# Patient Record
Sex: Female | Born: 1955 | Race: White | Hispanic: No | Marital: Married | Smoking: Never smoker
Health system: Southern US, Community
[De-identification: ages and names within clinical notes are randomized; demographics above are authoritative.]

## PROBLEM LIST (undated history)

## (undated) DIAGNOSIS — J45909 Unspecified asthma, uncomplicated: Secondary | ICD-10-CM

## (undated) DIAGNOSIS — K8020 Calculus of gallbladder without cholecystitis without obstruction: Secondary | ICD-10-CM

## (undated) DIAGNOSIS — N200 Calculus of kidney: Secondary | ICD-10-CM

## (undated) DIAGNOSIS — K219 Gastro-esophageal reflux disease without esophagitis: Secondary | ICD-10-CM

## (undated) DIAGNOSIS — I10 Essential (primary) hypertension: Secondary | ICD-10-CM

## (undated) DIAGNOSIS — I456 Pre-excitation syndrome: Secondary | ICD-10-CM

## (undated) DIAGNOSIS — Z8051 Family history of malignant neoplasm of kidney: Secondary | ICD-10-CM

## (undated) DIAGNOSIS — E669 Obesity, unspecified: Secondary | ICD-10-CM

## (undated) DIAGNOSIS — K3184 Gastroparesis: Secondary | ICD-10-CM

## (undated) DIAGNOSIS — I499 Cardiac arrhythmia, unspecified: Secondary | ICD-10-CM

## (undated) DIAGNOSIS — K589 Irritable bowel syndrome without diarrhea: Secondary | ICD-10-CM

## (undated) DIAGNOSIS — Z950 Presence of cardiac pacemaker: Secondary | ICD-10-CM

## (undated) DIAGNOSIS — C801 Malignant (primary) neoplasm, unspecified: Secondary | ICD-10-CM

## (undated) HISTORY — PX: STOMACH SURGERY: SHX791

## (undated) HISTORY — DX: Obesity, unspecified: E66.9

## (undated) HISTORY — DX: Calculus of gallbladder without cholecystitis without obstruction: K80.20

## (undated) HISTORY — PX: ABDOMINAL HYSTERECTOMY: SHX81

## (undated) HISTORY — PX: CHOLECYSTECTOMY: SHX55

## (undated) HISTORY — PX: PACEMAKER REMOVAL: SHX5066

## (undated) HISTORY — PX: KIDNEY SURGERY: SHX687

## (undated) HISTORY — DX: Cardiac arrhythmia, unspecified: I49.9

## (undated) HISTORY — PX: LITHOTRIPSY: SUR834

## (undated) HISTORY — DX: Gastro-esophageal reflux disease without esophagitis: K21.9

## (undated) HISTORY — DX: Irritable bowel syndrome, unspecified: K58.9

## (undated) HISTORY — PX: KNEE SURGERY: SHX244

## (undated) HISTORY — PX: PACEMAKER INSERTION: SHX728

## (undated) HISTORY — PX: CARDIAC SURGERY: SHX584

## (undated) HISTORY — PX: HEMORRHOID SURGERY: SHX153

## (undated) HISTORY — PX: AV NODE ABLATION: SHX1209

## (undated) HISTORY — PX: OTHER SURGICAL HISTORY: SHX169

---

## 1978-01-25 HISTORY — PX: APPENDECTOMY: SHX54

## 2010-04-22 ENCOUNTER — Emergency Department (HOSPITAL_BASED_OUTPATIENT_CLINIC_OR_DEPARTMENT_OTHER)
Admission: EM | Admit: 2010-04-22 | Discharge: 2010-04-23 | Disposition: A | Discharge status: Home / Self Care | Payer: Medicaid Other | Attending: Emergency Medicine | Admitting: Emergency Medicine

## 2010-04-22 ENCOUNTER — Emergency Department (INDEPENDENT_AMBULATORY_CARE_PROVIDER_SITE_OTHER): Payer: Medicaid Other

## 2010-04-22 DIAGNOSIS — Z79899 Other long term (current) drug therapy: Secondary | ICD-10-CM | POA: Insufficient documentation

## 2010-04-22 DIAGNOSIS — R109 Unspecified abdominal pain: Secondary | ICD-10-CM

## 2010-04-22 DIAGNOSIS — K7689 Other specified diseases of liver: Secondary | ICD-10-CM | POA: Insufficient documentation

## 2010-04-22 DIAGNOSIS — M545 Low back pain, unspecified: Secondary | ICD-10-CM | POA: Insufficient documentation

## 2010-04-22 DIAGNOSIS — R110 Nausea: Secondary | ICD-10-CM

## 2010-04-22 LAB — COMPREHENSIVE METABOLIC PANEL
ALT: 27 U/L (ref 0–35)
AST: 45 U/L — ABNORMAL HIGH (ref 0–37)
Alkaline Phosphatase: 83 U/L (ref 39–117)
CO2: 26 mEq/L (ref 19–32)
Calcium: 9.3 mg/dL (ref 8.4–10.5)
GFR calc Af Amer: >60 mL/min (ref >60)
Glucose, Bld: 103 mg/dL — ABNORMAL HIGH (ref 70–99)
Potassium: 5.4 mEq/L — ABNORMAL HIGH (ref 3.5–5.1)
Sodium: 146 mEq/L — ABNORMAL HIGH (ref 135–145)
Total Protein: 8.4 g/dL — ABNORMAL HIGH (ref 6.0–8.3)

## 2010-04-22 LAB — CBC
HCT: 41.7 % (ref 36.0–46.0)
Hemoglobin: 13.8 g/dL (ref 12.0–15.0)
MCHC: 33.1 g/dL (ref 30.0–36.0)
RDW: 12.6 % (ref 11.5–15.5)
WBC: 8 10*3/uL (ref 4.0–10.5)

## 2010-04-22 LAB — DIFFERENTIAL
Basophils Absolute: 0 10*3/uL (ref 0.0–0.1)
Basophils Relative: 1 % (ref 0–1)
Lymphocytes Relative: 46 % (ref 12–46)
Monocytes Absolute: 0.9 10*3/uL (ref 0.1–1.0)
Neutro Abs: 3.3 10*3/uL (ref 1.7–7.7)

## 2010-04-22 LAB — URINALYSIS, ROUTINE W REFLEX MICROSCOPIC
Nitrite: NEGATIVE
Specific Gravity, Urine: 1.029 (ref 1.005–1.030)
Urobilinogen, UA: 1 mg/dL (ref 0.0–1.0)
pH: 6 (ref 5.0–8.0)

## 2010-04-22 MED ORDER — IOHEXOL 300 MG/ML  SOLN
100.0000 mL | Freq: Once | INTRAMUSCULAR | Status: AC | PRN
  Administered 2010-04-22: 100 mL via INTRAVENOUS

## 2010-07-19 ENCOUNTER — Emergency Department (HOSPITAL_BASED_OUTPATIENT_CLINIC_OR_DEPARTMENT_OTHER)
Admission: EM | Admit: 2010-07-19 | Discharge: 2010-07-19 | Disposition: A | Discharge status: Home / Self Care | Payer: Medicaid Other | Attending: Emergency Medicine | Admitting: Emergency Medicine

## 2010-07-19 ENCOUNTER — Emergency Department (INDEPENDENT_AMBULATORY_CARE_PROVIDER_SITE_OTHER): Payer: Medicaid Other

## 2010-07-19 ENCOUNTER — Emergency Department (HOSPITAL_BASED_OUTPATIENT_CLINIC_OR_DEPARTMENT_OTHER): Payer: Medicaid Other

## 2010-07-19 DIAGNOSIS — M549 Dorsalgia, unspecified: Secondary | ICD-10-CM

## 2010-07-19 DIAGNOSIS — R112 Nausea with vomiting, unspecified: Secondary | ICD-10-CM | POA: Insufficient documentation

## 2010-07-19 DIAGNOSIS — R109 Unspecified abdominal pain: Secondary | ICD-10-CM

## 2010-07-19 DIAGNOSIS — R300 Dysuria: Secondary | ICD-10-CM

## 2010-07-19 LAB — CBC
HCT: 42.2 % (ref 36.0–46.0)
Hemoglobin: 14.2 g/dL (ref 12.0–15.0)
MCH: 30.1 pg (ref 26.0–34.0)
MCHC: 33.6 g/dL (ref 30.0–36.0)
MCV: 89.4 fL (ref 78.0–100.0)
Platelets: 226 K/uL (ref 150–400)
RBC: 4.72 MIL/uL (ref 3.87–5.11)
RDW: 13.1 % (ref 11.5–15.5)
WBC: 8.8 K/uL (ref 4.0–10.5)

## 2010-07-19 LAB — URINALYSIS, ROUTINE W REFLEX MICROSCOPIC
Bilirubin Urine: NEGATIVE
Glucose, UA: NEGATIVE mg/dL
Hgb urine dipstick: NEGATIVE
Ketones, ur: NEGATIVE mg/dL
Leukocytes, UA: NEGATIVE
Nitrite: NEGATIVE
Protein, ur: NEGATIVE mg/dL
Specific Gravity, Urine: 1.03 (ref 1.005–1.030)
Urobilinogen, UA: 0.2 mg/dL (ref 0.0–1.0)
pH: 6 (ref 5.0–8.0)

## 2010-07-19 LAB — COMPREHENSIVE METABOLIC PANEL WITH GFR
ALT: 24 U/L (ref 0–35)
AST: 29 U/L (ref 0–37)
Albumin: 4.2 g/dL (ref 3.5–5.2)
CO2: 27 meq/L (ref 19–32)
Calcium: 10.1 mg/dL (ref 8.4–10.5)
Chloride: 103 meq/L (ref 96–112)
GFR calc non Af Amer: >60 mL/min (ref >60)
Sodium: 142 meq/L (ref 135–145)
Total Bilirubin: 0.4 mg/dL (ref 0.3–1.2)

## 2010-07-19 LAB — COMPREHENSIVE METABOLIC PANEL
Alkaline Phosphatase: 88 U/L (ref 39–117)
BUN: 11 mg/dL (ref 6–23)
Creatinine, Ser: 0.9 mg/dL (ref 0.50–1.10)
GFR calc Af Amer: >60 mL/min (ref >60)
Glucose, Bld: 111 mg/dL — ABNORMAL HIGH (ref 70–99)
Potassium: 3.4 mEq/L — ABNORMAL LOW (ref 3.5–5.1)
Total Protein: 7.5 g/dL (ref 6.0–8.3)

## 2010-07-19 LAB — DIFFERENTIAL
Basophils Absolute: 0 10*3/uL (ref 0.0–0.1)
Basophils Relative: 0 % (ref 0–1)
Eosinophils Absolute: 0.1 K/uL (ref 0.0–0.7)
Eosinophils Relative: 1 % (ref 0–5)
Lymphocytes Relative: 37 % (ref 12–46)
Lymphs Abs: 3.3 10*3/uL (ref 0.7–4.0)
Monocytes Absolute: 1.1 10*3/uL — ABNORMAL HIGH (ref 0.1–1.0)
Monocytes Relative: 13 % — ABNORMAL HIGH (ref 3–12)
Neutro Abs: 4.3 10*3/uL (ref 1.7–7.7)
Neutrophils Relative %: 49 % (ref 43–77)

## 2010-07-19 LAB — LIPASE, BLOOD: Lipase: 74 U/L — ABNORMAL HIGH (ref 11–59)

## 2010-07-19 MED ORDER — IOHEXOL 300 MG/ML  SOLN
100.0000 mL | Freq: Once | INTRAMUSCULAR | Status: AC | PRN
  Administered 2010-07-19: 100 mL via INTRAVENOUS

## 2010-09-13 ENCOUNTER — Encounter: Payer: Self-pay | Admitting: *Deleted

## 2010-09-13 ENCOUNTER — Emergency Department (HOSPITAL_BASED_OUTPATIENT_CLINIC_OR_DEPARTMENT_OTHER)
Admission: EM | Admit: 2010-09-13 | Discharge: 2010-09-13 | Disposition: A | Discharge status: Home / Self Care | Payer: Medicaid Other | Attending: Emergency Medicine | Admitting: Emergency Medicine

## 2010-09-13 DIAGNOSIS — R51 Headache: Secondary | ICD-10-CM | POA: Insufficient documentation

## 2010-09-13 HISTORY — DX: Family history of malignant neoplasm of kidney: Z80.51

## 2010-09-13 HISTORY — DX: Gastroparesis: K31.84

## 2010-09-13 HISTORY — DX: Calculus of kidney: N20.0

## 2010-09-13 HISTORY — DX: Gastro-esophageal reflux disease without esophagitis: K21.9

## 2010-09-13 MED ORDER — PROMETHAZINE HCL 25 MG/ML IJ SOLN
25.0000 mg | Freq: Once | INTRAMUSCULAR | Status: AC
  Administered 2010-09-13: 25 mg via INTRAMUSCULAR
  Filled 2010-09-13: qty 1
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
## 2010-12-16 ENCOUNTER — Emergency Department (INDEPENDENT_AMBULATORY_CARE_PROVIDER_SITE_OTHER): Payer: Medicaid Other

## 2010-12-16 ENCOUNTER — Emergency Department (HOSPITAL_BASED_OUTPATIENT_CLINIC_OR_DEPARTMENT_OTHER)
Admission: EM | Admit: 2010-12-16 | Discharge: 2010-12-16 | Disposition: A | Discharge status: Home / Self Care | Payer: Medicaid Other | Attending: Emergency Medicine | Admitting: Emergency Medicine

## 2010-12-16 ENCOUNTER — Encounter (HOSPITAL_BASED_OUTPATIENT_CLINIC_OR_DEPARTMENT_OTHER): Payer: Self-pay | Admitting: Emergency Medicine

## 2010-12-16 DIAGNOSIS — Y92009 Unspecified place in unspecified non-institutional (private) residence as the place of occurrence of the external cause: Secondary | ICD-10-CM | POA: Insufficient documentation

## 2010-12-16 DIAGNOSIS — W19XXXA Unspecified fall, initial encounter: Secondary | ICD-10-CM

## 2010-12-16 DIAGNOSIS — K219 Gastro-esophageal reflux disease without esophagitis: Secondary | ICD-10-CM | POA: Insufficient documentation

## 2010-12-16 DIAGNOSIS — M25559 Pain in unspecified hip: Secondary | ICD-10-CM

## 2010-12-16 DIAGNOSIS — S7000XA Contusion of unspecified hip, initial encounter: Secondary | ICD-10-CM

## 2010-12-16 DIAGNOSIS — Z79899 Other long term (current) drug therapy: Secondary | ICD-10-CM | POA: Insufficient documentation

## 2010-12-16 DIAGNOSIS — W1789XA Other fall from one level to another, initial encounter: Secondary | ICD-10-CM | POA: Insufficient documentation

## 2010-12-16 MED ORDER — HYDROCODONE-ACETAMINOPHEN 5-500 MG PO TABS: 1.0000 | ORAL_TABLET | Freq: Four times a day (QID) | ORAL | Status: AC | PRN

## 2010-12-16 MED ORDER — HYDROCODONE-ACETAMINOPHEN 5-500 MG PO TABS: 1.0000 | ORAL_TABLET | Freq: Four times a day (QID) | ORAL | Status: DC | PRN

## 2010-12-16 NOTE — ED Provider Notes (Signed)
History     CSN: [id number] Arrival date & time: 12/16/2010  6:49 AM   First MD Initiated Contact with Patient 12/16/10 [phone]      Chief Complaint  Patient presents with  . Hip Pain    (Consider location/radiation/quality/duration/timing/severity/associated sxs/prior treatment) HPI Comments: Fell hanging christmas lights.  Patient is a 55 y.o. female presenting with hip pain. The history is provided by the patient.  Hip Pain This is a new problem. The current episode started 2 days ago. The problem occurs constantly. The problem has not changed since onset.Pertinent negatives include no abdominal pain. The symptoms are aggravated by twisting and bending. The symptoms are relieved by nothing. She has tried acetaminophen for the symptoms. The treatment provided mild relief.    Past Medical History  Diagnosis Date  . Migraine   . Kidney stones   . Gastroparesis   . FHx: kidney cancer   . Acid reflux     Past Surgical History  Procedure Date  . Other surgical history     open heart surgery - repair hole in heart  . Kidney surgery   . Stomach surgery   . Cholecystectomy     No family history on file.  History  Substance Use Topics  . Smoking status: Never Smoker   . Smokeless tobacco: Not on file  . Alcohol Use: No    OB History    Grav Para Term Preterm Abortions TAB SAB Ect Mult Living                  Review of Systems  Gastrointestinal: Negative for abdominal pain.  All other systems reviewed and are negative.    Allergies  Compazine; Morphine and related; Nsaids; Toradol; and Demerol  Home Medications   Current Outpatient Rx  Name Route Sig Dispense Refill  . CLONIDINE HCL 0.1 MG PO TABS Oral Take 0.1 mg by mouth 1 day or 1 dose.      Marland Kitchen SOLIFENACIN SUCCINATE 5 MG PO TABS Oral Take 10 mg by mouth daily.      Marland Kitchen VITAMIN D PO Oral Take by mouth.      . DEXLANSOPRAZOLE 60 MG PO CPDR Oral Take 60 mg by mouth 2 (two) times daily.      Marland Kitchen GABAPENTIN 300  MG PO CAPS Oral Take 1,200 mg by mouth 3 (three) times daily.      Marland Kitchen NITROGLYCERIN 0.4 MG/HR TD PT24 Transdermal Place 1 patch onto the skin as needed.      Marland Kitchen ONDANSETRON 8 MG PO TBDP Oral Take 8 mg by mouth every 8 (eight) hours as needed.      Marland Kitchen PROPRANOLOL HCL 60 MG PO TABS Oral Take 60 mg by mouth 3 (three) times daily.      Marland Kitchen ROSUVASTATIN CALCIUM 5 MG PO TABS Oral Take 5 mg by mouth daily.        BP 138/84  Pulse 106  Temp(Src) 97.8 F (36.6 C) (Oral)  Resp 18  SpO2 97%  Physical Exam  Constitutional: She is oriented to person, place, and time. She appears well-developed and well-nourished. No distress.  HENT:  Head: Normocephalic and atraumatic.  Neck: Normal range of motion. Neck supple.  Musculoskeletal:       The left hip is ttp over the lateral aspect.  There is pain with rom, but the leg is neurovasc intact distally.  Neurological: She is alert and oriented to person, place, and time.  Skin: Skin is warm. She is  not diaphoretic.    ED Course  Procedures (including critical care time)  Labs Reviewed - No data to display No results found.   No diagnosis found.    MDM  Xrays okay.  Appears to be a contusion.  Will discharge with pain meds, time.  F/U prn.        Geoffery Lyons, MD 12/16/10 [phone]

## 2010-12-16 NOTE — ED Notes (Signed)
Pt c/o left hip pain and lower back pain after falling off step stool mon.

## 2010-12-16 NOTE — ED Notes (Signed)
Pt ambulatory to [facility]

## 2010-12-16 NOTE — ED Notes (Signed)
Report received from Kellie Neal, RN, care assumed. 

## 2011-06-19 ENCOUNTER — Emergency Department (HOSPITAL_BASED_OUTPATIENT_CLINIC_OR_DEPARTMENT_OTHER)
Admission: EM | Admit: 2011-06-19 | Discharge: 2011-06-19 | Disposition: A | Discharge status: Home / Self Care | Payer: Medicaid Other | Attending: Emergency Medicine | Admitting: Emergency Medicine

## 2011-06-19 ENCOUNTER — Emergency Department (HOSPITAL_BASED_OUTPATIENT_CLINIC_OR_DEPARTMENT_OTHER): Payer: Medicaid Other

## 2011-06-19 ENCOUNTER — Encounter (HOSPITAL_BASED_OUTPATIENT_CLINIC_OR_DEPARTMENT_OTHER): Payer: Self-pay | Admitting: Emergency Medicine

## 2011-06-19 DIAGNOSIS — M25519 Pain in unspecified shoulder: Secondary | ICD-10-CM | POA: Insufficient documentation

## 2011-06-19 DIAGNOSIS — M25569 Pain in unspecified knee: Secondary | ICD-10-CM

## 2011-06-19 DIAGNOSIS — R0789 Other chest pain: Secondary | ICD-10-CM

## 2011-06-19 DIAGNOSIS — K219 Gastro-esophageal reflux disease without esophagitis: Secondary | ICD-10-CM | POA: Insufficient documentation

## 2011-06-19 DIAGNOSIS — R071 Chest pain on breathing: Secondary | ICD-10-CM | POA: Insufficient documentation

## 2011-06-19 DIAGNOSIS — Z79899 Other long term (current) drug therapy: Secondary | ICD-10-CM | POA: Insufficient documentation

## 2011-06-19 DIAGNOSIS — W108XXA Fall (on) (from) other stairs and steps, initial encounter: Secondary | ICD-10-CM | POA: Insufficient documentation

## 2011-06-19 DIAGNOSIS — M542 Cervicalgia: Secondary | ICD-10-CM | POA: Insufficient documentation

## 2011-06-19 HISTORY — DX: Pre-excitation syndrome: I45.6

## 2011-06-19 MED ORDER — CYCLOBENZAPRINE HCL 10 MG PO TABS: 10.0000 mg | ORAL_TABLET | Freq: Two times a day (BID) | ORAL | Status: AC | PRN

## 2011-06-19 NOTE — Discharge Instructions (Signed)
Contusion A contusion is a deep bruise. Contusions are the result of an injury that caused bleeding under the skin. The contusion may turn blue, purple, or yellow. Minor injuries will give you a painless contusion, but more severe contusions may stay painful and swollen for a few weeks.  CAUSES  A contusion is usually caused by a blow, trauma, or direct force to an area of the body. SYMPTOMS   Swelling and redness of the injured area.   Bruising of the injured area.   Tenderness and soreness of the injured area.   Pain.  DIAGNOSIS  The diagnosis can be made by taking a history and physical exam. An X-Arelyn Gauer, CT scan, or MRI may be needed to determine if there were any associated injuries, such as fractures. TREATMENT  Specific treatment will depend on what area of the body was injured. In general, the best treatment for a contusion is resting, icing, elevating, and applying cold compresses to the injured area. Over-the-counter medicines may also be recommended for pain control. Ask your caregiver what the best treatment is for your contusion. HOME CARE INSTRUCTIONS   Put ice on the injured area.   Put ice in a plastic bag.   Place a towel between your skin and the bag.   Leave the ice on for 15 to 20 minutes, 3 to 4 times a day.   Only take over-the-counter or prescription medicines for pain, discomfort, or fever as directed by your caregiver. Your caregiver may recommend avoiding anti-inflammatory medicines (aspirin, ibuprofen, and naproxen) for 48 hours because these medicines may increase bruising.   Rest the injured area.   If possible, elevate the injured area to reduce swelling.  SEEK IMMEDIATE MEDICAL CARE IF:   You have increased bruising or swelling.   You have pain that is getting worse.   Your swelling or pain is not relieved with medicines.  MAKE SURE YOU:   Understand these instructions.   Will watch your condition.   Will get help right away if you are not  doing well or get worse.  Document Released: 10/21/2004 Document Revised: 12/31/2010 Document Reviewed: 11/16/2010 [facility] Patient Information 2012 [place], [place].Chest Wall Pain Chest wall pain is pain in or around the bones and muscles of your chest. It may take up to 6 weeks to get better. It may take longer if you must stay physically active in your work and activities.  CAUSES  Chest wall pain may happen on its own. However, it may be caused by:  A viral illness like the flu.   Injury.   Coughing.   Exercise.   Arthritis.   Fibromyalgia.   Shingles.  HOME CARE INSTRUCTIONS   Avoid overtiring physical activity. Try not to strain or perform activities that cause pain. This includes any activities using your chest or your abdominal and side muscles, especially if heavy weights are used.   Put ice on the sore area.   Put ice in a plastic bag.   Place a towel between your skin and the bag.   Leave the ice on for 15 to 20 minutes per hour while awake for the first 2 days.   Only take over-the-counter or prescription medicines for pain, discomfort, or fever as directed by your caregiver.  SEEK IMMEDIATE MEDICAL CARE IF:   Your pain increases, or you are very uncomfortable.   You have a fever.   Your chest pain becomes worse.   You have new, unexplained symptoms.   You have  nausea or vomiting.   You feel sweaty or lightheaded.   You have a cough with phlegm (sputum), or you cough up blood.  MAKE SURE YOU:   Understand these instructions.   Will watch your condition.   Will get help right away if you are not doing well or get worse.  Document Released: 01/11/2005 Document Revised: 12/31/2010 Document Reviewed: 09/07/2010 [facility] Patient Information 2012 [place], [place].

## 2011-06-19 NOTE — ED Provider Notes (Signed)
History     CSN: [id number]  Arrival date & time 06/19/11  7829   First MD Initiated Contact with Patient 06/19/11 [phone]      Chief Complaint  Patient presents with  . Fall  . Chest Pain  . Shoulder Pain    (Consider location/radiation/quality/duration/timing/severity/associated sxs/prior treatment) HPI  Patient states she fell down 3 steps 8 days ago. She landed on her mid chest. She has pain since that time in the mid chest that is constant in nature sharp and burning. It increases with cough, deep breathing, and movement. She also has pain in the left shoulder that increases with any movement that occurred at the time of the fall. She is able to move her left arm without difficulty. She denies neck injury. She states she did strike her head at that time and is not have clear recollection of the fall. She has not had any headaches or focal deficits since that time. She also has had pain in her right knee which increases with movement and walking. She is ambulatory on this leg. She states that she called her doctor yesterday and was told to get checked out as she has a history of having WPW. She has not had a known coronary artery disease per the patient. She denies shortness of breath or lightheadedness. She has taken over-the-counter medicine without relief.  Past Medical History  Diagnosis Date  . Migraine   . Kidney stones   . Gastroparesis   . FHx: kidney cancer   . Acid reflux   . WPW (Wolff-Parkinson-White syndrome)     Past Surgical History  Procedure Date  . Other surgical history     open heart surgery - repair hole in heart  . Kidney surgery   . Stomach surgery   . Cholecystectomy   . Pacemaker insertion   . Pacemaker removal   . Abdominal hysterectomy   . Cardiac surgery   . Av node ablation     History reviewed. No pertinent family history.  History  Substance Use Topics  . Smoking status: Never Smoker   . Smokeless tobacco: Not on file  . Alcohol Use:  No    OB History    Grav Para Term Preterm Abortions TAB SAB Ect Mult Living                  Review of Systems  All other systems reviewed and are negative.    Allergies  Compazine; Ketorolac tromethamine; Morphine and related; Nsaids; Oxycodone; Tramadol; and Demerol  Home Medications   Current Outpatient Rx  Name Route Sig Dispense Refill  . VITAMIN D PO Oral Take by mouth.      . DEXLANSOPRAZOLE 60 MG PO CPDR Oral Take 60 mg by mouth 2 (two) times daily.      Marland Kitchen GABAPENTIN 300 MG PO CAPS Oral Take 1,200 mg by mouth 3 (three) times daily.      Marland Kitchen NITROGLYCERIN 0.4 MG/HR TD PT24 Transdermal Place 1 patch onto the skin as needed.      Marland Kitchen ONDANSETRON 8 MG PO TBDP Oral Take 8 mg by mouth every 8 (eight) hours as needed.        BP 146/94  Pulse 94  Temp(Src) 98.6 F (37 C) (Oral)  Resp 16  Ht 5\' 2"  (1.575 m)  Wt 220 lb (99.791 kg)  BMI 40.24 kg/m2  SpO2 98%  Physical Exam  Nursing note and vitals reviewed. Constitutional: She is oriented to person, place, and  time. She appears well-developed and well-nourished.  HENT:  Head: Normocephalic and atraumatic.  Eyes: Conjunctivae and EOM are normal. Pupils are equal, round, and reactive to light.  Neck: Normal range of motion. Neck supple.  Cardiovascular: Normal rate, regular rhythm, normal heart sounds and intact distal pulses.   Pulmonary/Chest: Effort normal and breath sounds normal.       Tenderness noted at right parasternal area and left anterior shoulder no contusions or deformity noted  Abdominal: Soft. Bowel sounds are normal.  Musculoskeletal: Normal range of motion.       Nontender over cervical thoracic and lumbosacral spine. No deformity or external signs of trauma noted  Mild right knee tenderness on the medial aspect of the knee without swelling or deformity or contusion noted. Knee has full active range of motion with no laxity of ligaments noted. No posterior tenderness or fullness is noted. Ankle and foot  have full active range of motion with dorsal pedalis pulses 2+.  Neurological: She is alert and oriented to person, place, and time.  Skin: Skin is warm and dry.  Psychiatric: She has a normal mood and affect. Thought content normal.    ED Course  Procedures (including critical care time)  Labs Reviewed - No data to display Dg Chest 2 View  06/19/2011  *RADIOLOGY REPORT*  Clinical Data: Larey Seat down stairs on Friday.  Landed on left side of the chest.  Pain in the left chest and shoulder.  Possible syncope.  CHEST - 2 VIEW  Comparison: None.  Findings: Patient has had median sternotomy and CABG.  The heart is enlarged.  No edema.  No focal consolidations or pleural effusions. No evidence for pneumothorax or acute fracture.  IMPRESSION:  1.  Cardiomegaly. 2. No evidence for acute cardiopulmonary abnormality.  Original Report Authenticated By: Patterson Hammersmith, M.D.   Dg Cervical Spine Complete  06/19/2011  *RADIOLOGY REPORT*  Clinical Data: Larey Seat down stairs.  Left-sided neck pain radiates into the left shoulder.  CERVICAL SPINE - COMPLETE 4+ VIEW  Comparison: None.  Findings: There are degenerative changes in the cervical spine, most notably at C4-5, C5-6.  Alignment is normal.  There is no evidence for acute fracture or subluxation.  Prevertebral soft tissues have a normal appearance.  Lung apices are clear.  IMPRESSION:  1.  Mild degenerative changes, primarily in the mid cervical spine. 2. No evidence for acute  abnormality.  Original Report Authenticated By: Patterson Hammersmith, M.D.   Dg Knee 2 Views Right  06/19/2011  *RADIOLOGY REPORT*  Clinical Data: Fall down stairs.  Right knee pain particularly medially along the patella.  RIGHT KNEE - 1-2 VIEW  Comparison: None.  Findings: Mild spurring of the tibial spines, patellofemoral joint, and medial compartment margin noted.  No fracture or knee effusion is observed.  No acute bony findings.  IMPRESSION:  1.  Mild osteoarthritis.   Otherwise, no  significant abnormality identified.  Original Report Authenticated By: Dellia Cloud, M.D.   Dg Shoulder Left  06/19/2011  *RADIOLOGY REPORT*  Clinical Data: Fall.  Chest pain.  Shoulder pain.  LEFT SHOULDER - 2+ VIEW  Comparison: None.  Findings: There is no evidence for acute fracture or dislocation. No soft tissue foreign body or gas identified.  Left lung apex is unremarkable in appearance.  Patient has had median sternotomy.  IMPRESSION:  No evidence for acute  abnormality.  Original Report Authenticated By: Patterson Hammersmith, M.D.     No diagnosis found.  MDM  Patient without any signs of acute fracture. Patient is allergic to multiple narcotics. She is advised to use acetaminophen and will be given a prescription for Flexeril.        Hilario Quarry, MD 06/19/11 1004

## 2011-06-19 NOTE — ED Notes (Addendum)
Pt fell last week, fell onto left chest.  Pt having pain over left chest (pacemaker insertion area) and left shoulder.  Pt believes she may have passed out at the time.  Pt states she is having soreness and tenderness to touch.  Some SOB at times.  Pt also c/o right knee pain.

## 2011-08-06 ENCOUNTER — Emergency Department (HOSPITAL_BASED_OUTPATIENT_CLINIC_OR_DEPARTMENT_OTHER): Payer: Medicaid Other

## 2011-08-06 ENCOUNTER — Encounter (HOSPITAL_BASED_OUTPATIENT_CLINIC_OR_DEPARTMENT_OTHER): Payer: Self-pay

## 2011-08-06 ENCOUNTER — Emergency Department (HOSPITAL_BASED_OUTPATIENT_CLINIC_OR_DEPARTMENT_OTHER)
Admission: EM | Admit: 2011-08-06 | Discharge: 2011-08-06 | Disposition: A | Discharge status: Home / Self Care | Payer: Medicaid Other | Attending: Emergency Medicine | Admitting: Emergency Medicine

## 2011-08-06 DIAGNOSIS — R079 Chest pain, unspecified: Secondary | ICD-10-CM

## 2011-08-06 DIAGNOSIS — R002 Palpitations: Secondary | ICD-10-CM | POA: Insufficient documentation

## 2011-08-06 DIAGNOSIS — I456 Pre-excitation syndrome: Secondary | ICD-10-CM | POA: Insufficient documentation

## 2011-08-06 LAB — MAGNESIUM: Magnesium: 1.5 mg/dL (ref 1.5–2.5)

## 2011-08-06 LAB — COMPREHENSIVE METABOLIC PANEL
BUN: 12 mg/dL (ref 6–23)
CO2: 23 mEq/L (ref 19–32)
Calcium: 9.3 mg/dL (ref 8.4–10.5)
Chloride: 104 mEq/L (ref 96–112)
Creatinine, Ser: 0.8 mg/dL (ref 0.50–1.10)
GFR calc non Af Amer: 81 mL/min — ABNORMAL LOW (ref >90)
Total Bilirubin: 0.3 mg/dL (ref 0.3–1.2)

## 2011-08-06 LAB — CBC
HCT: 39.5 % (ref 36.0–46.0)
MCH: 30.6 pg (ref 26.0–34.0)
MCV: 90.2 fL (ref 78.0–100.0)
RDW: 13 % (ref 11.5–15.5)
WBC: 7 10*3/uL (ref 4.0–10.5)

## 2011-08-06 LAB — TROPONIN I: Troponin I: <0.3 ng/mL (ref <0.30)

## 2011-08-06 MED ORDER — POTASSIUM CHLORIDE 20 MEQ/15ML (10%) PO LIQD
20.0000 meq | Freq: Once | ORAL | Status: AC
  Administered 2011-08-06: 20 meq via ORAL
  Filled 2011-08-06: qty 15

## 2011-08-06 MED ORDER — PROPRANOLOL HCL 60 MG PO TABS
60.0000 mg | ORAL_TABLET | Freq: Once | ORAL | Status: DC
  Filled 2011-08-06: qty 1

## 2011-08-06 NOTE — ED Notes (Signed)
Pt reports midsternal CP since yesterday. Also c/o frequent sensation of PVC's. Pt has long standing cardiac hx. Pt see's Dr Rudolpho Sevin with [facility]. Pt called his office this morning, and the nurse told her to come to ER to be evaluated. Pt took Aspirin 325mg  PO this morning and nitro prior to coming to ER.

## 2011-08-06 NOTE — ED Provider Notes (Signed)
History     CSN: [id number]  Arrival date & time 08/06/11  1850   First MD Initiated Contact with Patient 08/06/11 1932      Chief Complaint  Patient presents with  . Chest Pain    (Consider location/radiation/quality/duration/timing/severity/associated sxs/prior treatment) HPI  H/o WPW pw chest pain which started yesterday approx 0300. Has been intermittent with episodes lasting hours and "then easing up a bit". Today constant x >4 hours. Near syncope. +Palpitations with shortness of breath. Currently 8/10 at this time, left sided without radiation, dull. No relief with ntg. C/O new left facial numbness. Has headache after ntg patch which she took off pta. Does not feel like acid reflux. Denies h/o VTE in self or family. No recent hosp/surg/immob. No h/o cancer. Denies exogenous hormone use, no leg pain or swelling. Feels similar to intermittent palp from PVCs since she was 56yo per pt  S/p ablation 2010 Dr. Talmadge Chad ([facility]). Takes Inderal 60 bid. States HR usually 120s if she does not take her Inderal (took just pta)  Diet controlled HTN gm with CAD 40s-50s  ED Notes, ED Provider Notes from 08/06/11 0000 to 08/06/11 18:59:07       Angeline Slim, RN 08/06/2011 18:57      C/o CP-started yesterday    Past Medical History  Diagnosis Date  . Migraine   . Kidney stones   . Gastroparesis   . FHx: kidney cancer   . Acid reflux   . WPW (Wolff-Parkinson-White syndrome)     Past Surgical History  Procedure Date  . Other surgical history     open heart surgery - repair hole in heart  . Kidney surgery   . Stomach surgery   . Cholecystectomy   . Pacemaker insertion   . Pacemaker removal   . Abdominal hysterectomy   . Cardiac surgery   . Av node ablation     No family history on file.  History  Substance Use Topics  . Smoking status: Never Smoker   . Smokeless tobacco: Not on file  . Alcohol Use: No    OB History    Grav Para Term Preterm Abortions TAB SAB  Ect Mult Living                  Review of Systems  All other systems reviewed and are negative.   except as noted HPI   Allergies  Compazine; Ketorolac tromethamine; Morphine and related; Nsaids; Oxycodone; Toradol; Tramadol; and Demerol  Home Medications   Current Outpatient Rx  Name Route Sig Dispense Refill  . ACETAMINOPHEN 500 MG PO TABS Oral Take 1,500 mg by mouth every 6 (six) hours as needed. Patient used this medication for pain today at 3 pm.    . DEXLANSOPRAZOLE 60 MG PO CPDR Oral Take 60 mg by mouth 2 (two) times daily.      Marland Kitchen GABAPENTIN 300 MG PO CAPS Oral Take 1,200 mg by mouth 3 (three) times daily.      Marland Kitchen NITROGLYCERIN 0.4 MG/HR TD PT24 Transdermal Place 1 patch onto the skin as needed.      Marland Kitchen ONDANSETRON 8 MG PO TBDP Oral Take 8 mg by mouth every 8 (eight) hours as needed.      Marland Kitchen PROPRANOLOL HCL 60 MG PO TABS Oral Take 60 mg by mouth 2 (two) times daily.      BP 157/86  Pulse 98  Temp 98.1 F (36.7 C) (Oral)  Resp 20  Ht 5\' 2"  (1.575  m)  Wt 210 lb (95.255 kg)  BMI 38.41 kg/m2  SpO2 98%  Physical Exam  Nursing note and vitals reviewed. Constitutional: She is oriented to person, place, and time. She appears well-developed.  HENT:  Head: Atraumatic.  Mouth/Throat: Oropharynx is clear and moist.  Eyes: Conjunctivae and EOM are normal. Pupils are equal, round, and reactive to light.  Neck: Normal range of motion. Neck supple.  Cardiovascular: Normal rate, regular rhythm, normal heart sounds and intact distal pulses.   Pulmonary/Chest: Effort normal and breath sounds normal. No respiratory distress. She has no wheezes. She has no rales. She exhibits no tenderness.  Abdominal: Soft. She exhibits no distension. There is no tenderness. There is no rebound and no guarding.  Musculoskeletal: Normal range of motion.  Neurological: She is alert and oriented to person, place, and time.  Skin: Skin is warm and dry. No rash noted.  Psychiatric: She has a normal  mood and affect.    Date: 08/06/2011  Rate: 108  Rhythm: sinus tachycardia  QRS Axis: normal  Intervals: normal  ST/T Wave abnormalities: normal  Conduction Disutrbances:delta wave  Narrative Interpretation:   Old EKG Reviewed: unchanged Previous EKG HR 104   ED Course  Procedures (including critical care time)  Labs Reviewed  COMPREHENSIVE METABOLIC PANEL - Abnormal; Notable for the following:    Potassium 3.3 (*)     Glucose, Bld 133 (*)     GFR calc non Af Amer 81 (*)     All other components within normal limits  CBC  TROPONIN I  MAGNESIUM  PHOSPHORUS  D-DIMER, QUANTITATIVE   Dg Chest 2 View  08/06/2011  *RADIOLOGY REPORT*  Clinical Data: Chest discomfort and shortness of breath.  CHEST - 2 VIEW  Comparison: 06/19/2011.  Findings: Trachea is midline.  Heart size stable.  Question developing air space disease at the right lung base.  Lungs are otherwise clear.  Chronic thickening of the minor fissure.  No pleural fluid.  IMPRESSION: Possible developing air space disease at the right lung base. Follow-up to clearing is recommended.  Original Report Authenticated By: Reyes Ivan, M.D.     1. Chest pain   2. Heart palpitations   3. WPW (Wolff-Parkinson-White syndrome)     MDM  56yoF h/o WPW s/p ablation on inderal pw cp/palpitations. EKG nondiagnostic. CXR possible pna but no abnl lung sounds, afebrile, no cough/sob/fever. D/W Patient. Will not treat for now. Dimer negative and low risk (PERC neg except age). Doubt ACS by history- feels like her typical sx with WPW/palpitations. No arrythmia noted in ED -- 1-2 PVC on monitor. Labs wnl. D/W Dr. [facility] covering Dr. Talmadge Chad. No changes to current medications. F/U as outpatient on Monday. D/W Patient, feels comfortable going home, declining delta troponin. Aware of risks/benefits. No EMC precluding discharge at this time. Given Precautions for return. PMD f/u.Marland Kitchen        Forbes Cellar, MD 08/06/11  2146

## 2011-08-06 NOTE — ED Notes (Signed)
Dr. Webb at bedside

## 2011-08-06 NOTE — ED Notes (Signed)
C/o CP started yesterday 

## 2011-10-24 ENCOUNTER — Encounter (HOSPITAL_BASED_OUTPATIENT_CLINIC_OR_DEPARTMENT_OTHER): Payer: Self-pay | Admitting: Emergency Medicine

## 2011-10-24 ENCOUNTER — Emergency Department (HOSPITAL_BASED_OUTPATIENT_CLINIC_OR_DEPARTMENT_OTHER)
Admission: EM | Admit: 2011-10-24 | Discharge: 2011-10-24 | Disposition: A | Discharge status: Home / Self Care | Payer: Medicaid Other | Attending: Emergency Medicine | Admitting: Emergency Medicine

## 2011-10-24 DIAGNOSIS — K219 Gastro-esophageal reflux disease without esophagitis: Secondary | ICD-10-CM | POA: Insufficient documentation

## 2011-10-24 DIAGNOSIS — Z886 Allergy status to analgesic agent status: Secondary | ICD-10-CM | POA: Insufficient documentation

## 2011-10-24 DIAGNOSIS — N23 Unspecified renal colic: Secondary | ICD-10-CM | POA: Insufficient documentation

## 2011-10-24 DIAGNOSIS — Z85528 Personal history of other malignant neoplasm of kidney: Secondary | ICD-10-CM | POA: Insufficient documentation

## 2011-10-24 DIAGNOSIS — Z87442 Personal history of urinary calculi: Secondary | ICD-10-CM | POA: Insufficient documentation

## 2011-10-24 DIAGNOSIS — I456 Pre-excitation syndrome: Secondary | ICD-10-CM | POA: Insufficient documentation

## 2011-10-24 LAB — URINALYSIS, ROUTINE W REFLEX MICROSCOPIC
Glucose, UA: NEGATIVE mg/dL
Hgb urine dipstick: NEGATIVE
Ketones, ur: 15 mg/dL — AB
Protein, ur: NEGATIVE mg/dL

## 2011-10-24 MED ORDER — ONDANSETRON HCL 4 MG/2ML IJ SOLN
4.0000 mg | Freq: Once | INTRAMUSCULAR | Status: AC
  Administered 2011-10-24: 4 mg via INTRAVENOUS
  Filled 2011-10-24: qty 2

## 2011-10-24 MED ORDER — SODIUM CHLORIDE 0.9 % IV SOLN
Freq: Once | INTRAVENOUS | Status: AC
  Administered 2011-10-24: 21:00:00 via INTRAVENOUS

## 2011-10-24 MED ORDER — HYDROMORPHONE HCL PF 1 MG/ML IJ SOLN
0.5000 mg | Freq: Once | INTRAMUSCULAR | Status: AC
  Administered 2011-10-24: 0.5 mg via INTRAVENOUS
  Filled 2011-10-24: qty 1

## 2011-10-24 MED ORDER — HYDROMORPHONE HCL PF 1 MG/ML IJ SOLN
1.0000 mg | Freq: Once | INTRAMUSCULAR | Status: AC
  Administered 2011-10-24: 1 mg via INTRAVENOUS
  Filled 2011-10-24: qty 1

## 2011-10-24 MED ORDER — HYDROCODONE-ACETAMINOPHEN 5-325 MG PO TABS: 2.0000 | ORAL_TABLET | ORAL | Status: DC | PRN

## 2011-10-24 MED ORDER — ONDANSETRON 8 MG PO TBDP: ORAL_TABLET | ORAL | Status: DC

## 2011-10-24 NOTE — ED Provider Notes (Signed)
History  This chart was scribed for Hurman Horn, MD by Ladona Ridgel Day. This patient was seen in room MH10/MH10 and the patient's care was started at 1852.   CSN: [id number]  Arrival date & time 10/24/11  4098   First MD Initiated Contact with Patient 10/24/11 1929      Chief Complaint  Patient presents with  . Flank Pain   The history is provided by the patient. No language interpreter was used.   Caitlin Williams is a 56 y.o. female who presents to the Emergency Department with hx of kidney stones complaining of constant right flank/abdominal pain onset of 2 hours this PM. She states associated nausea and emesis without any blood in vomitus. Nothing makes her symptoms better. She states this episode feels similar to last episode of kidney stones in 2010. She states urinary frequency. She denies any diarrhea, weakness, numbness fever, confusion, cp, sob. She had partial left sided nephrectomy in 2007 for kidney CA. 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   Past Medical History  Diagnosis Date  . Migraine   . Kidney stones   . Gastroparesis   . FHx: kidney cancer   . Acid reflux   . WPW (Wolff-Parkinson-White syndrome)     Past Surgical History  Procedure Date  . Other surgical history     open heart surgery - repair hole in heart  . Kidney surgery   . Stomach surgery   . Cholecystectomy   . Pacemaker insertion   . Pacemaker removal   . Abdominal hysterectomy   . Cardiac surgery   . Av node ablation     No family history on file.  History  Substance Use Topics  . Smoking status: Never Smoker   . Smokeless tobacco: Not on file  . Alcohol Use: No    OB History    Grav Para Term Preterm Abortions TAB SAB Ect Mult Living                  Review of Systems 10 Systems reviewed and are negative for acute change except as noted in the HPI.  Allergies  Compazine; Ketorolac tromethamine; Morphine and related; Nsaids; Oxycodone;  Toradol; Tramadol; and Demerol  Home Medications   Current Outpatient Rx  Name Route Sig Dispense Refill  . ACETAMINOPHEN 500 MG PO TABS Oral Take 1,500 mg by mouth every 6 (six) hours as needed. Patient used this medication for pain today at 3 pm.    . DEXLANSOPRAZOLE 60 MG PO CPDR Oral Take 60 mg by mouth 2 (two) times daily.      Marland Kitchen GABAPENTIN 300 MG PO CAPS Oral Take 1,200 mg by mouth 3 (three) times daily.      Marland Kitchen HYDROCODONE-ACETAMINOPHEN 5-325 MG PO TABS Oral Take 2 tablets by mouth every 4 (four) hours as needed for pain. 20 tablet 0  . NITROGLYCERIN 0.4 MG/HR TD PT24 Transdermal Place 1 patch onto the skin as needed.      Marland Kitchen ONDANSETRON 8 MG PO TBDP  8mg  ODT q4 hours prn nausea 10 tablet 0  . ONDANSETRON 8 MG PO TBDP Oral Take 8 mg by mouth every 8 (eight) hours as needed.      Marland Kitchen PROPRANOLOL HCL 60 MG PO TABS Oral Take 60 mg by mouth 2 (two) times daily.      Triage Vitals: BP 152/106  Pulse 133  Temp 98 F (36.7 C) (Oral)  Resp 22  Ht  5\' 2"  (1.575 m)  Wt 210 lb (95.255 kg)  BMI 38.41 kg/m2  SpO2 97%  Physical Exam  Nursing note and vitals reviewed. Constitutional:       Awake, alert, nontoxic appearance.  HENT:  Head: Atraumatic.  Eyes: Right eye exhibits no discharge. Left eye exhibits no discharge.  Neck: Neck supple.  Cardiovascular: Regular rhythm and normal heart sounds.   No murmur heard.      tachycardic  Pulmonary/Chest: Effort normal and breath sounds normal. No respiratory distress. She has no wheezes. She has no rales. She exhibits no tenderness.  Abdominal: Soft. She exhibits no distension. There is tenderness (Mild diffuse right upper and lower abdominal tenderness. Non tender left side abdomen). There is no rebound.  Musculoskeletal: She exhibits tenderness.       Baseline ROM, no obvious new focal weakness. No midline back tenderness. Right CVA tenderness   Neurological:       Mental status and motor strength appears baseline for patient and situation.   Skin: No rash noted.  Psychiatric: She has a normal mood and affect.    ED Course  Procedures (including critical care time) DIAGNOSTIC STUDIES: Oxygen Saturation is 97% on room air, normal by my interpretation.    COORDINATION OF CARE: At 740 PM Discussed treatment plan with patient which includes pain/nauea medicine, and UA. Patient agrees.   Much better and wants discharge.  Labs Reviewed  URINALYSIS, ROUTINE W REFLEX MICROSCOPIC - Abnormal; Notable for the following:    Color, Urine AMBER (*)  BIOCHEMICALS MAY BE AFFECTED BY COLOR   APPearance CLOUDY (*)     Specific Gravity, Urine 1.036 (*)     Bilirubin Urine SMALL (*)     Ketones, ur 15 (*)     All other components within normal limits   No results found.   1. Renal colic       MDM  Pt stable in ED with no significant deterioration in condition.Patient / Family / Caregiver informed of clinical course, understand medical decision-making process, and agree with plan.I personally performed the services described in this documentation, which was scribed in my presence. The recorded information has been reviewed and considered.I doubt any other EMC precluding discharge at this time including, but not necessarily limited to the following:         Hurman Horn, MD 10/24/11 2358

## 2011-10-24 NOTE — ED Notes (Signed)
Pain rt flank with vomiting.   Hx kidney stones

## 2011-10-27 ENCOUNTER — Emergency Department (HOSPITAL_BASED_OUTPATIENT_CLINIC_OR_DEPARTMENT_OTHER)
Admission: EM | Admit: 2011-10-27 | Discharge: 2011-10-27 | Disposition: A | Discharge status: Home / Self Care | Payer: Medicaid Other | Attending: Emergency Medicine | Admitting: Emergency Medicine

## 2011-10-27 ENCOUNTER — Emergency Department (HOSPITAL_BASED_OUTPATIENT_CLINIC_OR_DEPARTMENT_OTHER): Payer: Medicaid Other

## 2011-10-27 ENCOUNTER — Other Ambulatory Visit: Payer: Self-pay

## 2011-10-27 ENCOUNTER — Encounter (HOSPITAL_BASED_OUTPATIENT_CLINIC_OR_DEPARTMENT_OTHER): Payer: Self-pay | Admitting: *Deleted

## 2011-10-27 DIAGNOSIS — R079 Chest pain, unspecified: Secondary | ICD-10-CM | POA: Insufficient documentation

## 2011-10-27 DIAGNOSIS — W5512XA Struck by horse, initial encounter: Secondary | ICD-10-CM

## 2011-10-27 DIAGNOSIS — R0789 Other chest pain: Secondary | ICD-10-CM

## 2011-10-27 DIAGNOSIS — S298XXA Other specified injuries of thorax, initial encounter: Secondary | ICD-10-CM | POA: Insufficient documentation

## 2011-10-27 DIAGNOSIS — W64XXXA Exposure to other animate mechanical forces, initial encounter: Secondary | ICD-10-CM | POA: Insufficient documentation

## 2011-10-27 MED ORDER — OXYCODONE-ACETAMINOPHEN 5-325 MG PO TABS: 1.0000 | ORAL_TABLET | ORAL | Status: DC | PRN

## 2011-10-27 MED ORDER — HYDROMORPHONE HCL PF 1 MG/ML IJ SOLN
1.0000 mg | Freq: Once | INTRAMUSCULAR | Status: AC
  Administered 2011-10-27: 1 mg via INTRAVENOUS
  Filled 2011-10-27: qty 1

## 2011-10-27 MED ORDER — HYDROMORPHONE HCL PF 1 MG/ML IJ SOLN
1.0000 mg | INTRAMUSCULAR | Status: AC
  Administered 2011-10-27: 1 mg via INTRAVENOUS
  Filled 2011-10-27: qty 1

## 2011-10-27 MED ORDER — PROMETHAZINE HCL 25 MG/ML IJ SOLN
25.0000 mg | Freq: Once | INTRAMUSCULAR | Status: AC
  Administered 2011-10-27: 25 mg via INTRAVENOUS
  Filled 2011-10-27: qty 1

## 2011-10-27 MED ORDER — PROMETHAZINE HCL 25 MG PO TABS: 12.5000 mg | ORAL_TABLET | Freq: Four times a day (QID) | ORAL | Status: DC | PRN

## 2011-10-27 MED ORDER — HYDROMORPHONE HCL PF 1 MG/ML IJ SOLN
1.0000 mg | Freq: Once | INTRAMUSCULAR | Status: AC
  Administered 2011-10-27: 1 mg via INTRAVENOUS

## 2011-10-27 MED ORDER — SODIUM CHLORIDE 0.9 % IV BOLUS (SEPSIS)
500.0000 mL | Freq: Once | INTRAVENOUS | Status: AC
  Administered 2011-10-27: 500 mL via INTRAVENOUS

## 2011-10-27 MED ORDER — HYDROMORPHONE HCL PF 1 MG/ML IJ SOLN
1.0000 mg | Freq: Once | INTRAMUSCULAR | Status: AC
  Administered 2011-10-27 (×2): 1 mg via INTRAVENOUS
  Filled 2011-10-27: qty 1

## 2011-10-27 MED ORDER — HYDROMORPHONE HCL PF 1 MG/ML IJ SOLN
INTRAMUSCULAR | Status: AC
  Administered 2011-10-27: 1 mg via INTRAVENOUS
  Filled 2011-10-27: qty 1

## 2011-10-27 MED ORDER — ONDANSETRON HCL 4 MG/2ML IJ SOLN
4.0000 mg | Freq: Once | INTRAMUSCULAR | Status: AC
  Administered 2011-10-27: 4 mg via INTRAVENOUS
  Filled 2011-10-27: qty 2

## 2011-10-27 NOTE — ED Notes (Signed)
Dr. Rulon Abide performing bedside ultrasound.

## 2011-10-27 NOTE — ED Notes (Signed)
MD at bedside. 

## 2011-10-27 NOTE — ED Provider Notes (Signed)
History     CSN: [id number]  Arrival date & time 10/27/11  1718   First MD Initiated Contact with Patient 10/27/11 1750      Chief Complaint  Patient presents with  . Chest Injury    (Consider location/radiation/quality/duration/timing/severity/associated sxs/prior treatment) HPIJanice Williams is a 56 y.o. female presents to the ER after being kicked by horse. She was kicked by a glancing blow from one of the horses hindfeet struck her left breast implant supple left side of her chest. She's currently complaining about left-sided chest pain, is 10/10, worse when she takes breath it becomes sharp, otherwise it is intense and dull, no cough, no hemoptysis, no vomiting. Patient's had no syncopal episodes, no dizziness or lightheadedness. Patient does have a history of Wolff-Parkinson-White syndrome, she had a radiofrequency ablation and since then has had no troubles with her WPW. She remarks that her heart rate is always tachycardic in the 110s to 120s and could be higher because she is in pain right now.    Past Medical History  Diagnosis Date  . Migraine   . Kidney stones   . Gastroparesis   . FHx: kidney cancer   . Acid reflux   . WPW (Wolff-Parkinson-White syndrome)     Past Surgical History  Procedure Date  . Other surgical history     open heart surgery - repair hole in heart  . Kidney surgery   . Stomach surgery   . Cholecystectomy   . Pacemaker insertion   . Pacemaker removal   . Abdominal hysterectomy   . Cardiac surgery   . Av node ablation     History reviewed. No pertinent family history.  History  Substance Use Topics  . Smoking status: Never Smoker   . Smokeless tobacco: Not on file  . Alcohol Use: No    OB History    Grav Para Term Preterm Abortions TAB SAB Ect Mult Living                  Review of Systems At least 10pt or greater review of systems completed and are negative except where specified in the HPI.  Allergies  Compazine; Ketorolac  tromethamine; Morphine and related; Nsaids; Oxycodone; Toradol; Tramadol; and Demerol  Home Medications   Current Outpatient Rx  Name Route Sig Dispense Refill  . ACETAMINOPHEN 500 MG PO TABS Oral Take 1,500 mg by mouth every 6 (six) hours as needed. Patient used this medication for pain today at 3 pm.    . DEXLANSOPRAZOLE 60 MG PO CPDR Oral Take 60 mg by mouth 2 (two) times daily.      Marland Kitchen GABAPENTIN 300 MG PO CAPS Oral Take 1,200 mg by mouth 3 (three) times daily.      Marland Kitchen HYDROCODONE-ACETAMINOPHEN 5-325 MG PO TABS Oral Take 2 tablets by mouth every 4 (four) hours as needed for pain. 20 tablet 0  . NITROGLYCERIN 0.4 MG/HR TD PT24 Transdermal Place 1 patch onto the skin as needed.      Marland Kitchen ONDANSETRON 8 MG PO TBDP  8mg  ODT q4 hours prn nausea 10 tablet 0  . ONDANSETRON 8 MG PO TBDP Oral Take 8 mg by mouth every 8 (eight) hours as needed.      Marland Kitchen PROPRANOLOL HCL 60 MG PO TABS Oral Take 60 mg by mouth 2 (two) times daily.      BP 136/68  Pulse 128  Temp 99.1 F (37.3 C) (Oral)  Resp 18  Ht 5\' 2"  (1.575 m)  Wt 210 lb (95.255 kg)  BMI 38.41 kg/m2  SpO2 97%  Physical Exam  Nursing notes reviewed.  Electronic medical record reviewed. VITAL SIGNS:   Filed Vitals:   10/27/11 1741 10/27/11 1841 10/27/11 1857 10/27/11 2136  BP: 129/105  136/68 110/67  Pulse:  124 128 122  Temp:      TempSrc:      Resp:  18 18 18   Height:      Weight:      SpO2:  96% 97% 94%   CONSTITUTIONAL: Awake, oriented, appears non-toxic HENT: Atraumatic, normocephalic, oral mucosa pink and moist, airway patent. Nares patent without drainage. External ears normal. EYES: Conjunctiva clear, EOMI, PERRLA NECK: Trachea midline, non-tender, supple CARDIOVASCULAR: Tachycardic, Normal rhythm, No murmurs, rubs, gallops PULMONARY/CHEST: Clear to auscultation, no rhonchi, wheezes, or rales. Symmetrical breath sounds. Abrasion to left breast continuing down to left-sided chest wall to the mid axillary line. Serious tender to  palpation with contusion. ABDOMINAL: Non-distended, soft, non-tender - no rebound or guarding.  BS normal. NEUROLOGIC: Non-focal, moving all four extremities, no gross sensory or motor deficits. EXTREMITIES: No clubbing, cyanosis, or edema SKIN: Warm, Dry, No erythema, No rash  ED Course  Procedures (including critical care time)  Date: 11/03/2011  Rate: 135  Rhythm: Sinus tachycardia  QRS Axis: normal  Intervals: normal  ST/T Wave abnormalities: normal  Conduction Disutrbances: none  Narrative Interpretation: Sinus tachycardia-no change since prior EKG.  Labs Reviewed - No data to display Dg Ribs Unilateral Left  10/27/2011  *RADIOLOGY REPORT*  Clinical Data: Injury, kicked by horse  LEFT RIBS - 2 VIEW  Comparison: Chest x-ray same day  Findings: Two views left ribs submitted.  No left rib fracture is identified.  No diagnostic pneumothorax.  The study is limited by patient's large body habitus.  IMPRESSION: No left rib fracture is identified.  No diagnostic pneumothorax.   Original Report Authenticated By: Natasha Mead, M.D.    Dg Chest Portable 1 View  10/27/2011  *RADIOLOGY REPORT*  Clinical Data: Chest injury and chest pain.  PORTABLE CHEST - 1 VIEW  Comparison: 08/06/2011  Findings: Cardiomegaly and cardiac surgical changes noted. The lungs are clear. There is no evidence of focal airspace disease, pulmonary edema, suspicious pulmonary nodule/mass, pleural effusion, or pneumothorax. No acute bony abnormalities are identified.  IMPRESSION: Cardiomegaly without evidence of active cardiopulmonary disease.   Original Report Authenticated By: Rosendo Gros, M.D.      1. Chest wall pain   2. Struck by horse      Medications  HYDROmorphone (DILAUDID) injection 1 mg (1 mg Intravenous Given 10/27/11 1840)  ondansetron (ZOFRAN) injection 4 mg (4 mg Intravenous Given 10/27/11 1800)  sodium chloride 0.9 % bolus 500 mL (500 mL Intravenous Given 10/27/11 1805)  HYDROmorphone (DILAUDID) injection  1 mg (1 mg Intravenous Given 10/27/11 1857)  HYDROmorphone (DILAUDID) injection 1 mg (1 mg Intravenous Given by Other 10/27/11 1910)  HYDROmorphone (DILAUDID) injection 1 mg (1 mg Intravenous Given 10/27/11 2110)  promethazine (PHENERGAN) injection 25 mg (25 mg Intravenous Given 10/27/11 2110)     MDM  Caitlin Williams is a 56 y.o. female presenting after being kicked by a horse. Appears to be in a glancing blow on the left chest wall-patient does have a history of WPW and is currently tachycardic however the patient states that she is usually tachycardic. She was unconcerned about her heart rate at this time. EKG shows sinus tachycardia occasional PAC. We'll treat the patient's chest wall pain aggressively obtain  films of the left chest wall and ribs. EKG is unremarkable, though shows a sinus tachycardia. Do not think her EKG represents WPW with atrial fibrillation since she is status post ablation, has been asymptomatic with respect to dizziness and syncope, and says her heart rate is typically tachycardic-out of expected to be slightly more tachycardic now with intense pain of the chest wall. X-rays of the chest and left ribs show no fractures, no pneumothorax, with some cardiomegaly without any acute cardiopulmonary disease.  Patient does respond well to pain medicine, blood pressure has been stable.  I do not think labs are indicated at this time.  The patient follow up with her primary care physician, she feels Safe and comfortable going home with some pain medicine.  I explained the diagnosis and have given explicit precautions to return to the ER including chest pain, syncope, dizziness, lightheadedness, shortness of breath or any other new or worsening symptoms. The patient understands and accepts the medical plan as it's been dictated and I have answered their questions. Discharge instructions concerning home care and prescriptions have been given.  The patient is STABLE and is discharged to home in  good condition.           Jones Skene, MD 11/03/11 1204

## 2011-10-27 NOTE — ED Notes (Signed)
Pt c/o kicked to chest by horse x 1 hr ago

## 2011-10-27 NOTE — ED Notes (Signed)
Attempted IV access x 1 - unsuccessful.

## 2011-10-27 NOTE — ED Notes (Signed)
Patient transported to X-ray 

## 2011-12-25 ENCOUNTER — Encounter (HOSPITAL_BASED_OUTPATIENT_CLINIC_OR_DEPARTMENT_OTHER): Payer: Self-pay

## 2011-12-25 ENCOUNTER — Emergency Department (HOSPITAL_BASED_OUTPATIENT_CLINIC_OR_DEPARTMENT_OTHER)
Admission: EM | Admit: 2011-12-25 | Discharge: 2011-12-25 | Disposition: A | Discharge status: Home / Self Care | Payer: Medicaid Other | Attending: Emergency Medicine | Admitting: Emergency Medicine

## 2011-12-25 ENCOUNTER — Emergency Department (HOSPITAL_BASED_OUTPATIENT_CLINIC_OR_DEPARTMENT_OTHER): Payer: Medicaid Other

## 2011-12-25 DIAGNOSIS — R109 Unspecified abdominal pain: Secondary | ICD-10-CM

## 2011-12-25 DIAGNOSIS — Z8679 Personal history of other diseases of the circulatory system: Secondary | ICD-10-CM | POA: Insufficient documentation

## 2011-12-25 DIAGNOSIS — R110 Nausea: Secondary | ICD-10-CM | POA: Insufficient documentation

## 2011-12-25 DIAGNOSIS — Z87442 Personal history of urinary calculi: Secondary | ICD-10-CM | POA: Insufficient documentation

## 2011-12-25 DIAGNOSIS — Z8719 Personal history of other diseases of the digestive system: Secondary | ICD-10-CM | POA: Insufficient documentation

## 2011-12-25 DIAGNOSIS — Y939 Activity, unspecified: Secondary | ICD-10-CM | POA: Insufficient documentation

## 2011-12-25 DIAGNOSIS — Z8669 Personal history of other diseases of the nervous system and sense organs: Secondary | ICD-10-CM | POA: Insufficient documentation

## 2011-12-25 DIAGNOSIS — Y929 Unspecified place or not applicable: Secondary | ICD-10-CM | POA: Insufficient documentation

## 2011-12-25 DIAGNOSIS — Z950 Presence of cardiac pacemaker: Secondary | ICD-10-CM | POA: Insufficient documentation

## 2011-12-25 DIAGNOSIS — W64XXXA Exposure to other animate mechanical forces, initial encounter: Secondary | ICD-10-CM | POA: Insufficient documentation

## 2011-12-25 DIAGNOSIS — Z79899 Other long term (current) drug therapy: Secondary | ICD-10-CM | POA: Insufficient documentation

## 2011-12-25 DIAGNOSIS — Z85528 Personal history of other malignant neoplasm of kidney: Secondary | ICD-10-CM | POA: Insufficient documentation

## 2011-12-25 DIAGNOSIS — R300 Dysuria: Secondary | ICD-10-CM | POA: Insufficient documentation

## 2011-12-25 DIAGNOSIS — S20219A Contusion of unspecified front wall of thorax, initial encounter: Secondary | ICD-10-CM

## 2011-12-25 LAB — URINALYSIS, ROUTINE W REFLEX MICROSCOPIC
Hgb urine dipstick: NEGATIVE
Leukocytes, UA: NEGATIVE
Nitrite: NEGATIVE
Protein, ur: NEGATIVE mg/dL
Urobilinogen, UA: 0.2 mg/dL (ref 0.0–1.0)

## 2011-12-25 LAB — BASIC METABOLIC PANEL
Calcium: 9.5 mg/dL (ref 8.4–10.5)
GFR calc Af Amer: 81 mL/min — ABNORMAL LOW (ref >90)
GFR calc non Af Amer: 70 mL/min — ABNORMAL LOW (ref >90)
Glucose, Bld: 119 mg/dL — ABNORMAL HIGH (ref 70–99)
Sodium: 141 mEq/L (ref 135–145)

## 2011-12-25 MED ORDER — ONDANSETRON HCL 4 MG/2ML IJ SOLN
4.0000 mg | Freq: Once | INTRAMUSCULAR | Status: AC
  Administered 2011-12-25: 4 mg via INTRAVENOUS
  Filled 2011-12-25: qty 2

## 2011-12-25 MED ORDER — SODIUM CHLORIDE 0.9 % IV SOLN
Freq: Once | INTRAVENOUS | Status: AC
  Administered 2011-12-25: 08:00:00 via INTRAVENOUS

## 2011-12-25 MED ORDER — HYDROMORPHONE HCL PF 1 MG/ML IJ SOLN
1.0000 mg | Freq: Once | INTRAMUSCULAR | Status: AC
  Administered 2011-12-25: 1 mg via INTRAVENOUS
  Filled 2011-12-25: qty 1

## 2011-12-25 NOTE — ED Provider Notes (Signed)
History     CSN: [id number]  Arrival date & time 12/25/11  6213   First MD Initiated Contact with Patient 12/25/11 [phone]      Chief Complaint  Patient presents with  . Flank Pain    (Consider location/radiation/quality/duration/timing/severity/associated sxs/prior treatment) HPI Comments: Patient history of kidney stones and kidney cancer on the left presents with sudden onset of left flank pain. She states the pain started this morning and has intensified throughout the morning. It's associated with nausea but no vomiting. She denies he fevers or chills. She has a little bit of increased pain on urination but denies any other urinary difficulties or gross hematuria. She states she's had multiple kidney stones in the past and has had lithotripsy for a kidney stones in the past. She also describes pain over her left ribs but feels this is a different type pain and was associated with rib trauma about a month ago when a horse kicked her in the ribs. She currently sees Dr. Margo Aye in [facility] for her kidney stones.  Patient is a 56 y.o. female presenting with flank pain.  Flank Pain Associated symptoms include chest pain (left rib pain) and abdominal pain. Pertinent negatives include no headaches and no shortness of breath.    Past Medical History  Diagnosis Date  . Migraine   . Kidney stones   . Gastroparesis   . FHx: kidney cancer   . Acid reflux   . WPW (Wolff-Parkinson-White syndrome)     Past Surgical History  Procedure Date  . Other surgical history     open heart surgery - repair hole in heart  . Kidney surgery   . Stomach surgery   . Cholecystectomy   . Pacemaker insertion   . Pacemaker removal   . Abdominal hysterectomy   . Cardiac surgery   . Av node ablation     No family history on file.  History  Substance Use Topics  . Smoking status: Never Smoker   . Smokeless tobacco: Not on file  . Alcohol Use: No    OB History    Grav Para Term Preterm Abortions  TAB SAB Ect Mult Living                  Review of Systems  Constitutional: Negative for fever, chills, diaphoresis and fatigue.  HENT: Negative for congestion, rhinorrhea and sneezing.   Eyes: Negative.   Respiratory: Negative for cough, chest tightness and shortness of breath.   Cardiovascular: Positive for chest pain (left rib pain). Negative for leg swelling.  Gastrointestinal: Positive for nausea and abdominal pain. Negative for vomiting, diarrhea and blood in stool.  Genitourinary: Positive for dysuria and flank pain. Negative for frequency, hematuria and difficulty urinating.  Musculoskeletal: Negative for back pain and arthralgias.  Skin: Negative for rash.  Neurological: Negative for dizziness, speech difficulty, weakness, numbness and headaches.    Allergies  Compazine; Ketorolac tromethamine; Morphine and related; Nsaids; Oxycodone; Toradol; Tramadol; and Demerol  Home Medications   Current Outpatient Rx  Name  Route  Sig  Dispense  Refill  . ACETAMINOPHEN 500 MG PO TABS   Oral   Take 1,500 mg by mouth every 6 (six) hours as needed. Patient used this medication for pain today at 3 pm.         . DEXLANSOPRAZOLE 60 MG PO CPDR   Oral   Take 60 mg by mouth 2 (two) times daily.           Marland Kitchen  GABAPENTIN 300 MG PO CAPS   Oral   Take 1,200 mg by mouth 3 (three) times daily.           Marland Kitchen HYDROCODONE-ACETAMINOPHEN 5-325 MG PO TABS   Oral   Take 2 tablets by mouth every 4 (four) hours as needed for pain.   20 tablet   0   . NITROGLYCERIN 0.4 MG/HR TD PT24   Transdermal   Place 1 patch onto the skin as needed.           Marland Kitchen ONDANSETRON 8 MG PO TBDP      8mg  ODT q4 hours prn nausea   10 tablet   0   . ONDANSETRON 8 MG PO TBDP   Oral   Take 8 mg by mouth every 8 (eight) hours as needed.           . OXYCODONE-ACETAMINOPHEN 5-325 MG PO TABS   Oral   Take 1-2 tablets by mouth every 4 (four) hours as needed for pain.   23 tablet   0   . PROMETHAZINE HCL 25  MG PO TABS   Oral   Take 0.5-1 tablets (12.5-25 mg total) by mouth every 6 (six) hours as needed for nausea.   30 tablet   0   . PROPRANOLOL HCL 60 MG PO TABS   Oral   Take 60 mg by mouth 2 (two) times daily.           BP 172/94  Pulse 114  Temp 98 F (36.7 C) (Oral)  Resp 20  Ht 5\' 2"  (1.575 m)  Wt 210 lb (95.255 kg)  BMI 38.41 kg/m2  SpO2 99%  Physical Exam  Constitutional: She is oriented to person, place, and time. She appears well-developed and well-nourished.  HENT:  Head: Normocephalic and atraumatic.  Eyes: Pupils are equal, round, and reactive to light.  Neck: Normal range of motion. Neck supple.  Cardiovascular: Normal rate, regular rhythm and normal heart sounds.   Pulmonary/Chest: Effort normal and breath sounds normal. No respiratory distress. She has no wheezes. She has no rales. She exhibits tenderness (mild TTP left lower anterior/lateral ribs.  no crepitus or deformity).  Abdominal: Soft. Bowel sounds are normal. There is tenderness (moderate TTP left flank). There is no rebound and no guarding.  Musculoskeletal: Normal range of motion. She exhibits no edema.  Lymphadenopathy:    She has no cervical adenopathy.  Neurological: She is alert and oriented to person, place, and time.  Skin: Skin is warm and dry. No rash noted.  Psychiatric: She has a normal mood and affect.    ED Course  Procedures (including critical care time)  Results for orders placed during the hospital encounter of 12/25/11  URINALYSIS, ROUTINE W REFLEX MICROSCOPIC      Component Value Range   Color, Urine YELLOW  YELLOW   APPearance CLEAR  CLEAR   Specific Gravity, Urine 1.021  1.005 - 1.030   pH 6.0  5.0 - 8.0   Glucose, UA NEGATIVE  NEGATIVE mg/dL   Hgb urine dipstick NEGATIVE  NEGATIVE   Bilirubin Urine NEGATIVE  NEGATIVE   Ketones, ur NEGATIVE  NEGATIVE mg/dL   Protein, ur NEGATIVE  NEGATIVE mg/dL   Urobilinogen, UA 0.2  0.0 - 1.0 mg/dL   Nitrite NEGATIVE  NEGATIVE    Leukocytes, UA NEGATIVE  NEGATIVE  BASIC METABOLIC PANEL      Component Value Range   Sodium 141  135 - 145 mEq/L   Potassium 3.8  3.5 - 5.1 mEq/L   Chloride 103  96 - 112 mEq/L   CO2 26  19 - 32 mEq/L   Glucose, Bld 119 (*) 70 - 99 mg/dL   BUN 11  6 - 23 mg/dL   Creatinine, Ser 1.61  0.50 - 1.10 mg/dL   Calcium 9.5  8.4 - 09.6 mg/dL   GFR calc non Af Amer 70 (*) >90 mL/min   GFR calc Af Amer 81 (*) >90 mL/min   Ct Abdomen Pelvis Wo Contrast  12/25/2011  *RADIOLOGY REPORT*  Clinical Data: Left flank pain.  History of partial left nephrectomy for renal cell carcinoma in 2010.  History of renal stones.  CT ABDOMEN AND PELVIS WITHOUT CONTRAST  Technique:  Multidetector CT imaging of the abdomen and pelvis was performed following the standard protocol without intravenous contrast.  Comparison: CT abdomen pelvis with contrast 07/19/2010 and 04/22/2010  Findings: There are postsurgical changes of the left kidney compatible with reported history of partial left nephrectomy. There is some chronic hazy density in the left perinephric fat, likely due to postsurgical scarring, unchanged.  There are no stones within the left kidney.  There is no hydronephrosis on the left.  There are two punctate stones within the right renal collecting system.  There is no hydronephrosis or perinephric stranding on the right.  Both ureters are normal in caliber.  No ureteral stone or periureteric stranding is present.  No focal hepatic lesion or biliary ductal dilatation.  The patient is status post cholecystectomy.  The spleen, adrenal glands, and pancreas are within normal limits.  The stomach is decompressed.  Small bowel loops are normal in caliber and wall thickness.  The appendix is not definitely identified.  There are no inflammatory changes in the pericecal region.  Urinary bladder is decompressed.  No bladder calculi visualized.  Hysterectomy.  Negative for adnexal mass.  Negative for lymphadenopathy or ascites.   There are scattered colonic diverticula, most prominent in the sigmoid region.  Abdominal aorta is normal in caliber.  Minimal postsurgical scarring in the midline inferior anterior abdominal wall.  Otherwise, the soft tissues of the body wall are unremarkable.  Disc height narrowing and vaccuum disc at L5-S1.  No acute or suspicious bony abnormality.  IMPRESSION: 1. No acute findings identified the abdomen or pelvis.  Negative for urinary tract obstruction. 2.  Two nonobstructing punctate stones in the right renal collecting system.  3.  Postsurgical changes of the left kidney, stable. 4.  Hysterectomy and cholecystectomy. 5.  Uncomplicated colonic diverticulosis.   Original Report Authenticated By: Britta Mccreedy, M.D.     No results found.    1. Rib contusion   2. Abdominal  pain, other specified site       MDM  Patient has pain controlled after the allotted here in emergency department. She had no evidence of kidney stone, mass or other etiology for the pain on the CT scan. There is no evidence of urinary tract infection. I discussed the findings with the patient and she now feels that it could just be soreness in her ribs. Her abdominal exam still has some mild tenderness in the left flank but she has no significant tenderness or peritoneal signs. I did discuss my concern for her narcotic use over the last few months. She's had 4 prescriptions for opiates in October and 2 prescriptions in September as well as one prescription in August. She says that she does not like taking the narcotics meds and does not want  a prescription today as long as there is not a kidney stone found. I advised her to followup with her primary care physician within the next 2-3 days if her symptoms are not improving or return here as needed for any worsening symptoms        Rolan Bucco, MD 12/25/11 1001

## 2011-12-25 NOTE — ED Notes (Signed)
Pt reports she awakened with left flank pain and nausea this am.

## 2011-12-25 NOTE — ED Notes (Signed)
MD at bedside. 

## 2011-12-25 NOTE — ED Notes (Signed)
Pt returned from CT °

## 2011-12-25 NOTE — ED Notes (Signed)
Patient transported to CT via stretcher.

## 2012-01-26 HISTORY — PX: ESOPHAGOGASTRODUODENOSCOPY: SHX1529

## 2012-07-02 ENCOUNTER — Encounter (HOSPITAL_BASED_OUTPATIENT_CLINIC_OR_DEPARTMENT_OTHER): Payer: Self-pay

## 2012-07-02 ENCOUNTER — Emergency Department (HOSPITAL_BASED_OUTPATIENT_CLINIC_OR_DEPARTMENT_OTHER)
Admission: EM | Admit: 2012-07-02 | Discharge: 2012-07-02 | Disposition: A | Discharge status: Other Acute Inpatient Hospital | Payer: Medicaid Other | Attending: Emergency Medicine | Admitting: Emergency Medicine

## 2012-07-02 ENCOUNTER — Emergency Department (HOSPITAL_BASED_OUTPATIENT_CLINIC_OR_DEPARTMENT_OTHER): Payer: Medicaid Other

## 2012-07-02 DIAGNOSIS — R55 Syncope and collapse: Secondary | ICD-10-CM | POA: Insufficient documentation

## 2012-07-02 DIAGNOSIS — Z79899 Other long term (current) drug therapy: Secondary | ICD-10-CM | POA: Insufficient documentation

## 2012-07-02 DIAGNOSIS — R42 Dizziness and giddiness: Secondary | ICD-10-CM | POA: Insufficient documentation

## 2012-07-02 DIAGNOSIS — Z950 Presence of cardiac pacemaker: Secondary | ICD-10-CM | POA: Insufficient documentation

## 2012-07-02 DIAGNOSIS — Z9889 Other specified postprocedural states: Secondary | ICD-10-CM | POA: Insufficient documentation

## 2012-07-02 DIAGNOSIS — R079 Chest pain, unspecified: Secondary | ICD-10-CM

## 2012-07-02 DIAGNOSIS — Z8719 Personal history of other diseases of the digestive system: Secondary | ICD-10-CM | POA: Insufficient documentation

## 2012-07-02 DIAGNOSIS — M6281 Muscle weakness (generalized): Secondary | ICD-10-CM | POA: Insufficient documentation

## 2012-07-02 DIAGNOSIS — G43909 Migraine, unspecified, not intractable, without status migrainosus: Secondary | ICD-10-CM | POA: Insufficient documentation

## 2012-07-02 DIAGNOSIS — I456 Pre-excitation syndrome: Secondary | ICD-10-CM | POA: Insufficient documentation

## 2012-07-02 DIAGNOSIS — R259 Unspecified abnormal involuntary movements: Secondary | ICD-10-CM | POA: Insufficient documentation

## 2012-07-02 DIAGNOSIS — R0602 Shortness of breath: Secondary | ICD-10-CM | POA: Insufficient documentation

## 2012-07-02 DIAGNOSIS — K219 Gastro-esophageal reflux disease without esophagitis: Secondary | ICD-10-CM | POA: Insufficient documentation

## 2012-07-02 DIAGNOSIS — R209 Unspecified disturbances of skin sensation: Secondary | ICD-10-CM | POA: Insufficient documentation

## 2012-07-02 DIAGNOSIS — Z87442 Personal history of urinary calculi: Secondary | ICD-10-CM | POA: Insufficient documentation

## 2012-07-02 LAB — BASIC METABOLIC PANEL
BUN: 15 mg/dL (ref 6–23)
GFR calc Af Amer: 81 mL/min — ABNORMAL LOW (ref >90)
GFR calc non Af Amer: 70 mL/min — ABNORMAL LOW (ref >90)
Potassium: 3.9 mEq/L (ref 3.5–5.1)

## 2012-07-02 LAB — CBC WITH DIFFERENTIAL/PLATELET
Basophils Relative: 0 % (ref 0–1)
Eosinophils Absolute: 0.1 10*3/uL (ref 0.0–0.7)
Lymphs Abs: 3.1 10*3/uL (ref 0.7–4.0)
MCH: 32.8 pg (ref 26.0–34.0)
MCHC: 33.7 g/dL (ref 30.0–36.0)
Neutrophils Relative %: 42 % — ABNORMAL LOW (ref 43–77)
Platelets: 190 10*3/uL (ref 150–400)
RBC: 4.18 MIL/uL (ref 3.87–5.11)

## 2012-07-02 MED ORDER — MORPHINE SULFATE 4 MG/ML IJ SOLN
INTRAMUSCULAR | Status: AC
  Filled 2012-07-02: qty 1

## 2012-07-02 MED ORDER — ASPIRIN 81 MG PO CHEW
324.0000 mg | CHEWABLE_TABLET | Freq: Once | ORAL | Status: AC
  Administered 2012-07-02: 324 mg via ORAL
  Filled 2012-07-02: qty 4

## 2012-07-02 MED ORDER — ONDANSETRON HCL 4 MG/2ML IJ SOLN
INTRAMUSCULAR | Status: AC
  Administered 2012-07-02: 4 mg via INTRAVENOUS
  Filled 2012-07-02: qty 2

## 2012-07-02 MED ORDER — HYDROMORPHONE HCL PF 1 MG/ML IJ SOLN: 1.0000 mg | Freq: Once | INTRAMUSCULAR | Status: AC

## 2012-07-02 MED ORDER — ONDANSETRON HCL 4 MG/2ML IJ SOLN: 4.0000 mg | Freq: Once | INTRAMUSCULAR | Status: AC

## 2012-07-02 MED ORDER — HYDROMORPHONE HCL PF 1 MG/ML IJ SOLN
INTRAMUSCULAR | Status: AC
  Administered 2012-07-02: 1 mg via INTRAVENOUS
  Filled 2012-07-02: qty 1

## 2012-07-02 MED ORDER — NITROGLYCERIN 0.4 MG SL SUBL
0.4000 mg | SUBLINGUAL_TABLET | SUBLINGUAL | Status: DC | PRN
  Filled 2012-07-02: qty 25

## 2012-07-02 NOTE — ED Notes (Signed)
Pt states that she woke up this morning about 0400 and felt really dizzy, had syncopal episode, woke up on the floor about 4 hours later.  Pt states that she had onset of chest pain about 3-4 hours ago.  Pt states that she has L arm weakness, numbness, and tingling numbness in the jaw.  Extensive cardiac hx, pts husband reports some slurred speech, appears normal to this RN, FAST assessment normal, grips normal, some arm drift noted in L arm.

## 2012-07-02 NOTE — ED Provider Notes (Addendum)
History  This chart was scribed for Gilda Crease, MD by Ardelia Mems, ED Scribe. This patient was seen in room MH04/MH04 and the patient's care was started at 3:01 PM.   CSN: [id number]  Arrival date & time 07/02/12  1434     Chief Complaint  Patient presents with  . Chest Pain     The history is provided by the patient. No language interpreter was used.    HPI Comments: Caitlin Williams is a 57 y.o. female with an extensive cardiac history who presents to the Emergency Department complaining of constant, moderate chest pain onset about 4 hours ago. Pt describes her pain as discomfort. Pt states that she woke up about 12 hours ago, became very dizzy and had a syncopal episode. Pt states that she woke up after this 4 hour syncopal episode and felt normal., but then had an onset of chest pain about 4 hours ago. There is associated mild SOB. Pt states that she has a h/o of chest pain, but reports new, current symptoms of left arm weakness,  numbness and tingling numbness in the jaw. Pt states that she had a catherization and ablation in 2010, which revealed no blockage. Pt denies leg numbness, but states that her legs have been shaky recently. Pt denies fever, chills, vomiting, diarrhea or any other symptoms.     Past Medical History  Diagnosis Date  . Migraine   . Kidney stones   . Gastroparesis   . FHx: kidney cancer   . Acid reflux   . WPW (Wolff-Parkinson-White syndrome)     Past Surgical History  Procedure Laterality Date  . Other surgical history      open heart surgery - repair hole in heart  . Kidney surgery    . Stomach surgery    . Cholecystectomy    . Pacemaker insertion    . Pacemaker removal    . Abdominal hysterectomy    . Cardiac surgery    . Av node ablation      History reviewed. No pertinent family history.  History  Substance Use Topics  . Smoking status: Never Smoker   . Smokeless tobacco: Not on file  . Alcohol Use: No    OB History    Grav Para Term Preterm Abortions TAB SAB Ect Mult Living                  Review of Systems  Constitutional: Negative for fever and chills.  Respiratory: Positive for shortness of breath.   Cardiovascular: Positive for chest pain. Negative for leg swelling.  Gastrointestinal: Negative for nausea, vomiting and diarrhea.  Neurological: Positive for dizziness, syncope and numbness.   A complete 10 system review of systems was obtained and all systems are negative except as noted in the HPI and PMH.   Allergies  Compazine; Ketorolac tromethamine; Morphine and related; Nsaids; Oxycodone; Toradol; Tramadol; and Demerol  Home Medications   Current Outpatient Rx  Name  Route  Sig  Dispense  Refill  . acetaminophen (TYLENOL) 500 MG tablet   Oral   Take 1,500 mg by mouth every 6 (six) hours as needed. Patient used this medication for pain today at 3 pm.         . flecainide (TAMBOCOR) 100 MG tablet   Oral   Take 100 mg by mouth 2 (two) times daily.         Marland Kitchen gabapentin (NEURONTIN) 300 MG capsule   Oral   Take 900 mg by  mouth 3 (three) times daily.          . nitroGLYCERIN (NITRODUR - DOSED IN MG/24 HR) 0.4 mg/hr   Transdermal   Place 1 patch onto the skin as needed.           . ondansetron (ZOFRAN ODT) 8 MG disintegrating tablet      8mg  ODT q4 hours prn nausea   10 tablet   0   . ondansetron (ZOFRAN-ODT) 8 MG disintegrating tablet   Oral   Take 8 mg by mouth every 8 (eight) hours as needed.           . pantoprazole (PROTONIX) 40 MG tablet   Oral   Take 40 mg by mouth 2 (two) times daily.         . propranolol (INDERAL) 60 MG tablet   Oral   Take 60 mg by mouth 2 (two) times daily.         Marland Kitchen dexlansoprazole (DEXILANT) 60 MG capsule   Oral   Take 60 mg by mouth 2 (two) times daily.           Marland Kitchen HYDROcodone-acetaminophen (NORCO) 5-325 MG per tablet   Oral   Take 2 tablets by mouth every 4 (four) hours as needed for pain.   20 tablet   0   .  oxyCODONE-acetaminophen (PERCOCET/ROXICET) 5-325 MG per tablet   Oral   Take 1-2 tablets by mouth every 4 (four) hours as needed for pain.   23 tablet   0   . promethazine (PHENERGAN) 25 MG tablet   Oral   Take 0.5-1 tablets (12.5-25 mg total) by mouth every 6 (six) hours as needed for nausea.   30 tablet   0     Triage Vitals: BP 138/88  Pulse 102  Temp(Src) 98.5 F (36.9 C) (Oral)  Resp 20  Ht 5\' 2"  (1.575 m)  Wt 210 lb (95.255 kg)  BMI 38.4 kg/m2  SpO2 97%  Physical Exam  Nursing note and vitals reviewed. Constitutional: She is oriented to person, place, and time. She appears well-developed and well-nourished. No distress.  HENT:  Head: Normocephalic and atraumatic.  Right Ear: Hearing normal.  Left Ear: Hearing normal.  Nose: Nose normal.  Mouth/Throat: Oropharynx is clear and moist and mucous membranes are normal.  Eyes: Conjunctivae and EOM are normal. Pupils are equal, round, and reactive to light.  Neck: Normal range of motion. Neck supple. No tracheal deviation present.  Cardiovascular: Normal rate, regular rhythm, S1 normal, S2 normal and normal heart sounds.  Exam reveals no gallop and no friction rub.   No murmur heard. Pulmonary/Chest: Effort normal and breath sounds normal. No respiratory distress. She exhibits no tenderness.  Abdominal: Soft. Normal appearance and bowel sounds are normal. There is no hepatosplenomegaly. There is no tenderness. There is no rebound, no guarding, no tenderness at McBurney's point and negative Murphy's sign. No hernia.  Musculoskeletal: Normal range of motion. She exhibits no tenderness.  Neurological: She is alert and oriented to person, place, and time. She has normal strength. No cranial nerve deficit or sensory deficit. Coordination normal. GCS eye subscore is 4. GCS verbal subscore is 5. GCS motor subscore is 6.  Skin: Skin is warm, dry and intact. No rash noted. No cyanosis.  Psychiatric: She has a normal mood and affect.  Her speech is normal and behavior is normal. Thought content normal.    ED Course  Procedures (including critical care time)  EKG:  Date: 07/02/2012  Rate: 96  Rhythm: normal sinus rhythm  QRS Axis: normal  Intervals: normal  ST/T Wave abnormalities: nonspecific T wave changes  Conduction Disutrbances:none  Narrative Interpretation:   Old EKG Reviewed: anterior T wave changes (inversion) are new    DIAGNOSTIC STUDIES: Oxygen Saturation is 97% on RA, normal by my interpretation.    COORDINATION OF CARE: 3:05 PM- Pt advised of plan for treatment and pt agrees.   Medications  nitroGLYCERIN (NITROSTAT) SL tablet 0.4 mg (not administered)  aspirin chewable tablet 324 mg (324 mg Oral Given 07/02/12 1602)  ondansetron (ZOFRAN) injection 4 mg (4 mg Intravenous Given 07/02/12 1609)     Labs Reviewed  BASIC METABOLIC PANEL - Abnormal; Notable for the following:    Glucose, Bld 118 (*)    GFR calc non Af Amer 70 (*)    GFR calc Af Amer 81 (*)    All other components within normal limits  CBC WITH DIFFERENTIAL - Abnormal; Notable for the following:    Neutrophils Relative % 42 (*)    Monocytes Relative 15 (*)    Monocytes Absolute 1.1 (*)    All other components within normal limits  TROPONIN I   Ct Head Wo Contrast  07/02/2012   *RADIOLOGY REPORT*  Clinical Data: Syncope.  History of renal cell carcinoma.  CT HEAD WITHOUT CONTRAST  Technique:  Contiguous axial images were obtained from the base of the skull through the vertex without contrast.  Comparison: None.  Findings: No mass lesion, mass effect, midline shift, hydrocephalus, hemorrhage.  No territorial ischemia or acute infarction.  Paranasal sinuses and mastoid air cells clear.  IMPRESSION: Negative CT head.   Original Report Authenticated By: Andreas Newport, M.D.     Diagnosis: 1. Chest Pain 2. Palpitations 3. Syncope 4. Left side neuro deficit symptoms    MDM  Patient presents to the ER for multiple problems. Patient  reports history of WPW with ablation. She says she has persistent palpitations and PVCs. Patient is here because she had an episode of syncope this morning. She reports that she woke at 4 AM to go to the bathroom, became dizzy and then passed out walking back from the bathroom. Has been reportedly found her on the floor this morning at 8 AM.  Patient now experiencing chest pain. Pain is in the center of her chest that has been persistent for 3-4 hours. She has radiation of pain up into the jaw area. She is mildly short of breath.  Patient also complaining of heaviness, numbness and weakness of the left arm as well as on the left side of her face.  I cannot find one disease process I would explain all of these symptoms. Patient does have multiple PVCs here in the ER and a history of WPW. This coupled with the fact she is experiencing chest pain and had a syncopal episode we feel that she requires observation in the hospital. Additionally, she is having symptoms of weakness, numbness and tingling of the left side of her face and left arm which could not be explained by the chest pain. Head CT did not show any acute intracranial abnormality, but would need to be observed for possible TIA as well.  Discussed with Dr. Kevin Fenton, [facility] hospitalist. Patient accepted for transfer. Patient stable for transfer.  PCP: Dr. Gayleen Orem at [facility] Cardiologist: Dr. Rudolpho Sevin   I personally performed the services described in this documentation, which was scribed in my presence. The recorded information has been reviewed and is accurate.  Gilda Crease, MD 07/02/12 1627  Gilda Crease, MD 07/02/12 [phone]

## 2012-07-02 NOTE — ED Notes (Signed)
Patient stated she had some pain across her chset but refused nitro tab, stated she had a headache & the med would make it worse and she was sure the pain was muscular, not cardiac

## 2012-07-13 IMAGING — CR DG ABDOMEN 1V
2 series · 2 of 2 positions shown · non-contrast
Comparison: Scout image for abdominal CT scan dated 04/22/2010

CLINICAL DATA: Abdominal pain and back pain.  Painful urination.
History kidney stones.  History of partial left nephrectomy.

ABDOMEN - 1 VIEW

[t abdomen supine (1 of 2)]
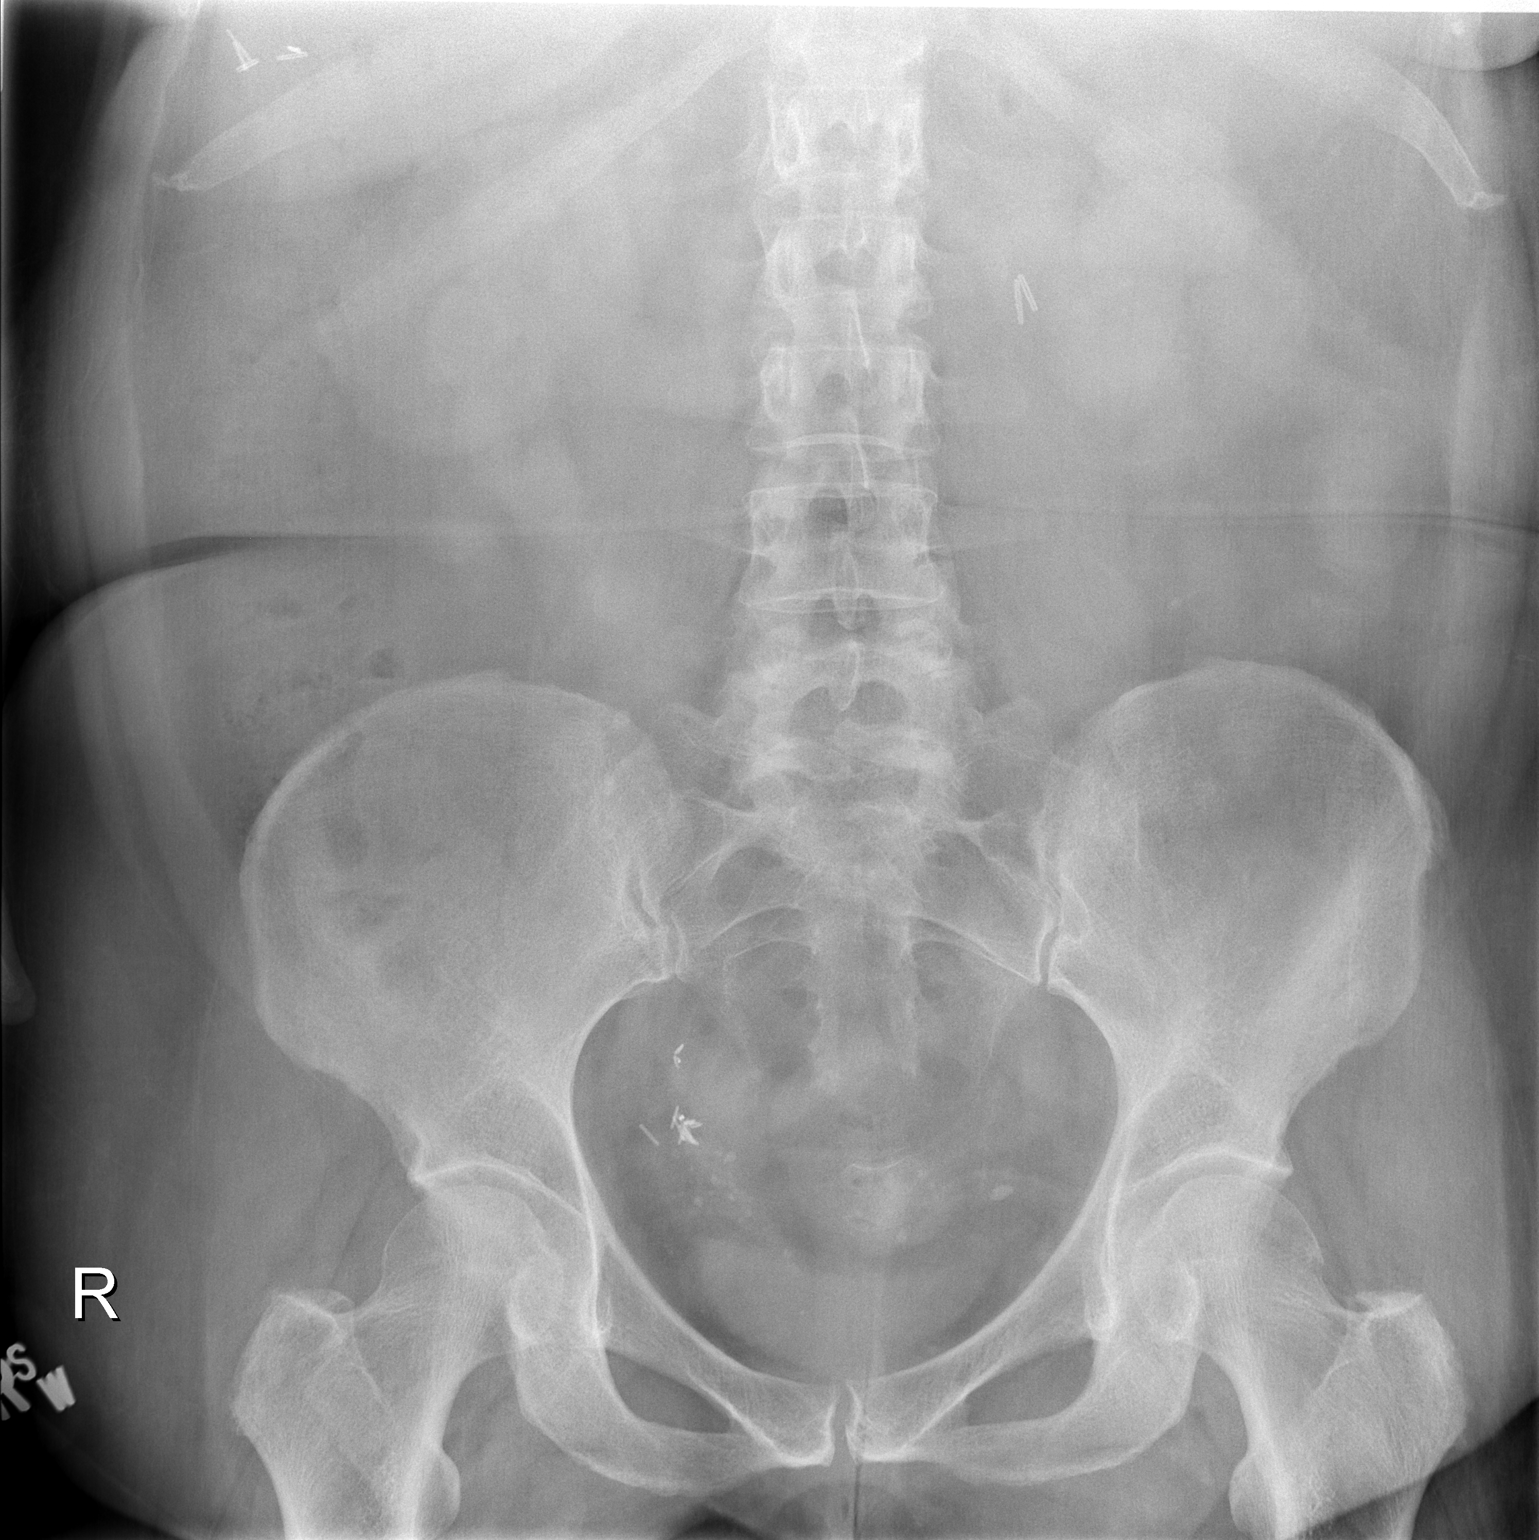

[t abdomen supine (2 of 2)]
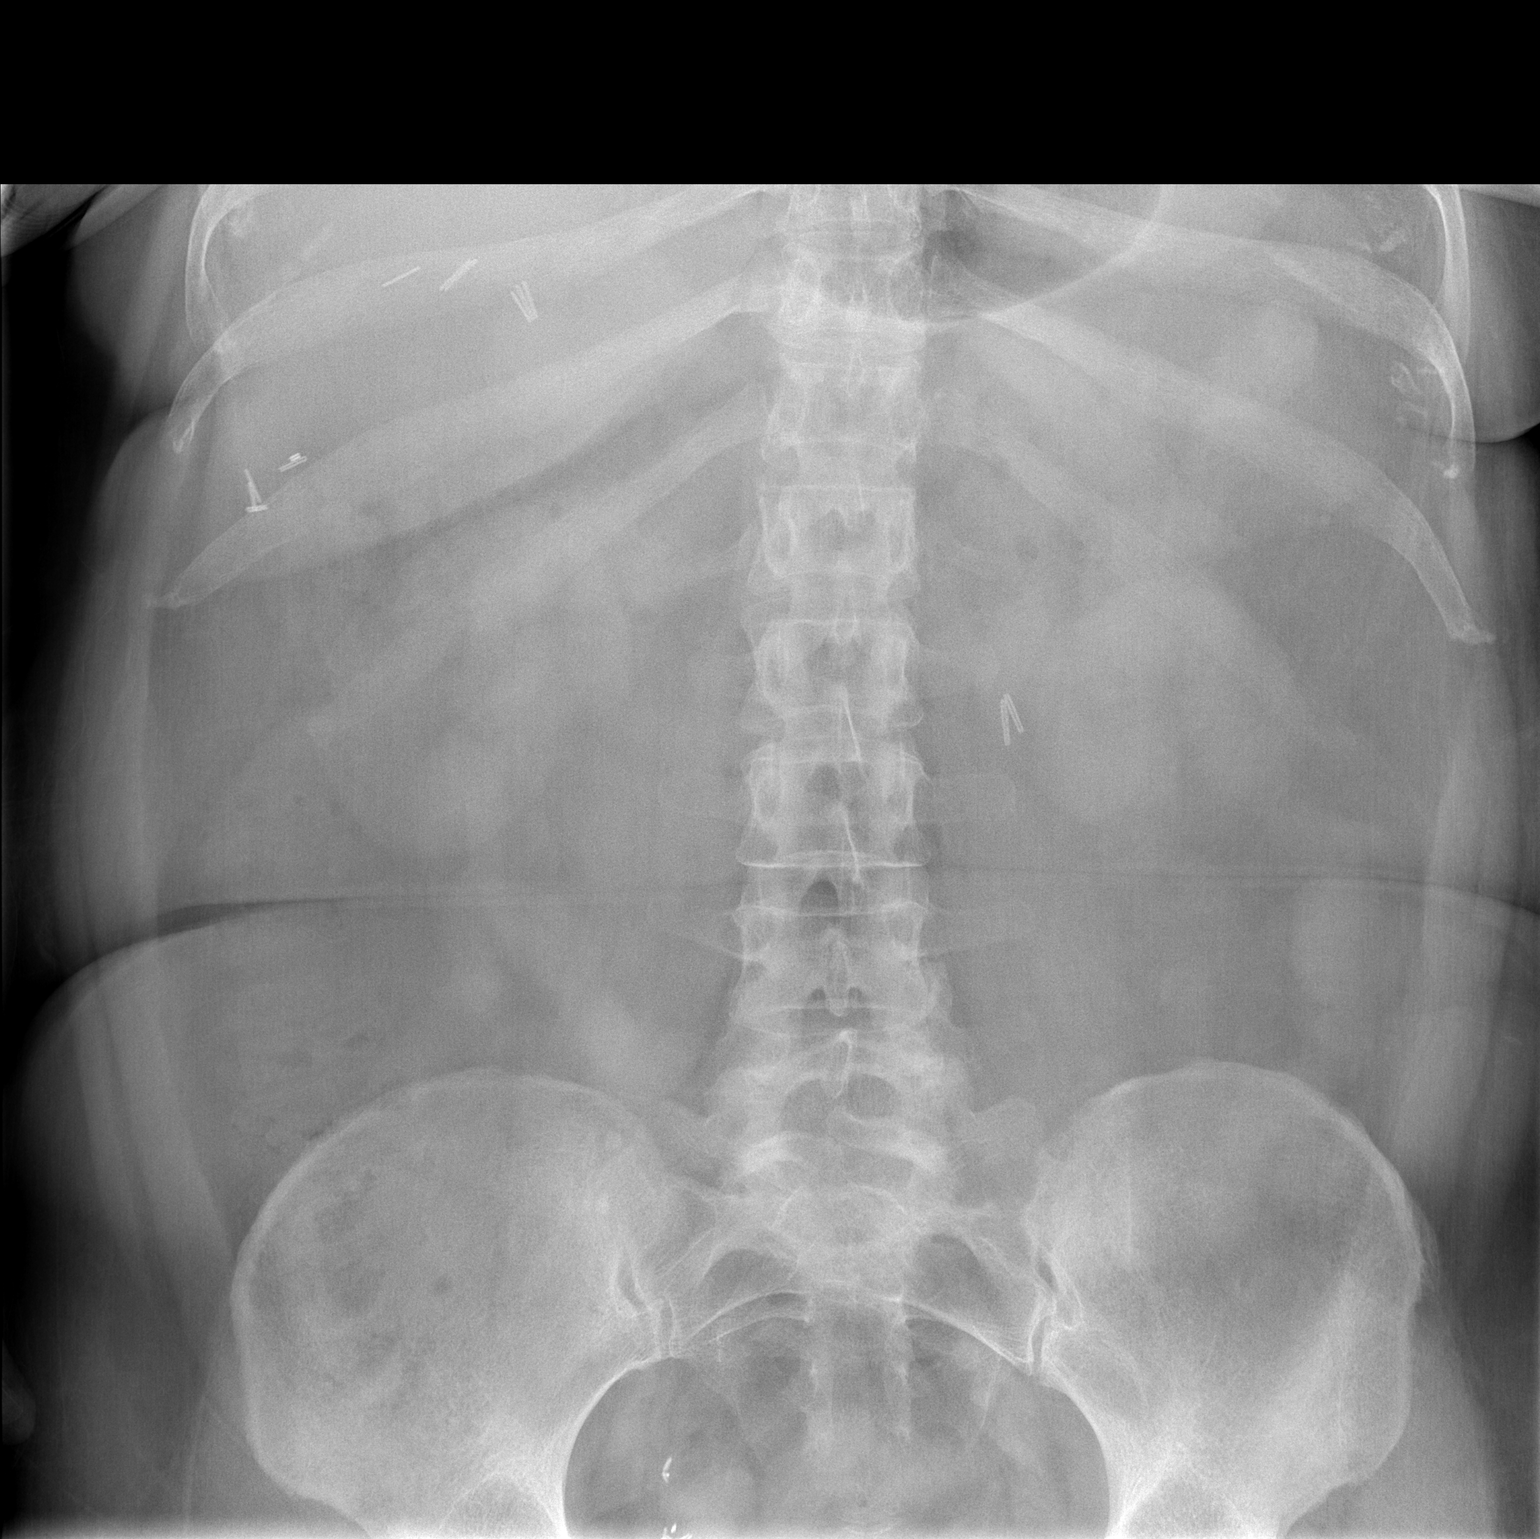

[2 of 2 positions shown; findings below may reference images not displayed]

FINDINGS: The bowel gas pattern is normal with only a tiny amount
of air in the ascending colon.  There are numerous phleboliths in
the pelvis as well as multiple surgical clips in the right side of
the pelvis.  No osseous abnormality.  No free fluid.
IMPRESSION: Benign-appearing abdomen.

## 2013-01-02 ENCOUNTER — Emergency Department (HOSPITAL_BASED_OUTPATIENT_CLINIC_OR_DEPARTMENT_OTHER): Payer: Medicaid Other

## 2013-01-02 ENCOUNTER — Emergency Department (HOSPITAL_BASED_OUTPATIENT_CLINIC_OR_DEPARTMENT_OTHER)
Admission: EM | Admit: 2013-01-02 | Discharge: 2013-01-02 | Disposition: A | Discharge status: Home / Self Care | Payer: Medicaid Other | Attending: Emergency Medicine | Admitting: Emergency Medicine

## 2013-01-02 ENCOUNTER — Encounter (HOSPITAL_BASED_OUTPATIENT_CLINIC_OR_DEPARTMENT_OTHER): Payer: Self-pay | Admitting: Emergency Medicine

## 2013-01-02 DIAGNOSIS — Z8679 Personal history of other diseases of the circulatory system: Secondary | ICD-10-CM | POA: Insufficient documentation

## 2013-01-02 DIAGNOSIS — R071 Chest pain on breathing: Secondary | ICD-10-CM | POA: Insufficient documentation

## 2013-01-02 DIAGNOSIS — Z950 Presence of cardiac pacemaker: Secondary | ICD-10-CM | POA: Insufficient documentation

## 2013-01-02 DIAGNOSIS — Z9889 Other specified postprocedural states: Secondary | ICD-10-CM | POA: Insufficient documentation

## 2013-01-02 DIAGNOSIS — G43909 Migraine, unspecified, not intractable, without status migrainosus: Secondary | ICD-10-CM | POA: Insufficient documentation

## 2013-01-02 DIAGNOSIS — K219 Gastro-esophageal reflux disease without esophagitis: Secondary | ICD-10-CM | POA: Insufficient documentation

## 2013-01-02 DIAGNOSIS — Z85528 Personal history of other malignant neoplasm of kidney: Secondary | ICD-10-CM | POA: Insufficient documentation

## 2013-01-02 DIAGNOSIS — Z87442 Personal history of urinary calculi: Secondary | ICD-10-CM | POA: Insufficient documentation

## 2013-01-02 DIAGNOSIS — Z79899 Other long term (current) drug therapy: Secondary | ICD-10-CM | POA: Insufficient documentation

## 2013-01-02 DIAGNOSIS — R0789 Other chest pain: Secondary | ICD-10-CM

## 2013-01-02 HISTORY — DX: Presence of cardiac pacemaker: Z95.0

## 2013-01-02 LAB — TROPONIN I: Troponin I: <0.3 ng/mL (ref <0.30)

## 2013-01-02 MED ORDER — OXYCODONE-ACETAMINOPHEN 5-325 MG PO TABS: 1.0000 | ORAL_TABLET | ORAL | Status: DC | PRN

## 2013-01-02 MED ORDER — ONDANSETRON HCL 4 MG/2ML IJ SOLN
4.0000 mg | Freq: Once | INTRAMUSCULAR | Status: AC
  Administered 2013-01-02: 4 mg via INTRAVENOUS
  Filled 2013-01-02: qty 2

## 2013-01-02 MED ORDER — HYDROMORPHONE HCL PF 1 MG/ML IJ SOLN
1.0000 mg | Freq: Once | INTRAMUSCULAR | Status: AC
  Administered 2013-01-02: 1 mg via INTRAVENOUS
  Filled 2013-01-02: qty 1

## 2013-01-02 NOTE — ED Notes (Addendum)
Pain in left upper chest and shoulder at site of previous pacemaker.  Pacemaker removed in 1980s.  No known injury. Was seen in in Ed in [place] 10 days ago and given pain meds and to use heat but it is getting worse and pain is spreading.

## 2013-01-02 NOTE — ED Provider Notes (Signed)
CSN: [id number]     Arrival date & time 01/02/13  1427 History   First MD Initiated Contact with Patient 01/02/13 1449     Chief Complaint  Patient presents with  . Shoulder Pain   (Consider location/radiation/quality/duration/timing/severity/associated sxs/prior Treatment) HPI Comments: Pt states that she is hurting over the area where her pacemakers used to be:pt states that this has happened intermittently but this is the worst it has every been:pt states she was seen in the er a couple of day ago and nothing was found:denies sob, fever, cough, redness or warmth  Patient is a 57 y.o. female presenting with shoulder pain. The history is provided by the patient. No language interpreter was used.  Shoulder Pain This is a recurrent problem. The current episode started in the past 7 days. The problem occurs constantly. The problem has been gradually worsening. Pertinent negatives include no fever. Exacerbated by: movement or palpation.    Past Medical History  Diagnosis Date  . Migraine   . Kidney stones   . Gastroparesis   . FHx: kidney cancer   . Acid reflux   . WPW (Wolff-Parkinson-White syndrome)   . Pacemaker    Past Surgical History  Procedure Laterality Date  . Other surgical history      open heart surgery - repair hole in heart  . Kidney surgery    . Stomach surgery    . Cholecystectomy    . Pacemaker insertion    . Pacemaker removal    . Abdominal hysterectomy    . Cardiac surgery    . Av node ablation    . Hemorrhoid surgery     No family history on file. History  Substance Use Topics  . Smoking status: Never Smoker   . Smokeless tobacco: Not on file  . Alcohol Use: No   OB History   Grav Para Term Preterm Abortions TAB SAB Ect Mult Living                 Review of Systems  Constitutional: Negative for fever.  Respiratory: Negative.   Cardiovascular: Negative.     Allergies  Compazine; Ketorolac tromethamine; Morphine and related; Nsaids; Oxycodone;  Toradol; Tramadol; and Demerol  Home Medications   Current Outpatient Rx  Name  Route  Sig  Dispense  Refill  . flecainide (TAMBOCOR) 100 MG tablet   Oral   Take 100 mg by mouth 2 (two) times daily as needed.         Marland Kitchen lisinopril (PRINIVIL,ZESTRIL) 20 MG tablet   Oral   Take 20 mg by mouth 2 (two) times daily.         Marland Kitchen acetaminophen (TYLENOL) 500 MG tablet   Oral   Take 1,500 mg by mouth every 6 (six) hours as needed. Patient used this medication for pain today at 3 pm.         . dexlansoprazole (DEXILANT) 60 MG capsule   Oral   Take 60 mg by mouth 2 (two) times daily.           . flecainide (TAMBOCOR) 100 MG tablet   Oral   Take 100 mg by mouth 2 (two) times daily.         Marland Kitchen gabapentin (NEURONTIN) 300 MG capsule   Oral   Take 900 mg by mouth 3 (three) times daily.          Marland Kitchen HYDROcodone-acetaminophen (NORCO) 5-325 MG per tablet   Oral   Take 2 tablets by mouth  every 4 (four) hours as needed for pain.   20 tablet   0   . nitroGLYCERIN (NITRODUR - DOSED IN MG/24 HR) 0.4 mg/hr   Transdermal   Place 1 patch onto the skin as needed.           . ondansetron (ZOFRAN ODT) 8 MG disintegrating tablet      8mg  ODT q4 hours prn nausea   10 tablet   0   . ondansetron (ZOFRAN-ODT) 8 MG disintegrating tablet   Oral   Take 8 mg by mouth every 8 (eight) hours as needed.           Marland Kitchen oxyCODONE-acetaminophen (PERCOCET/ROXICET) 5-325 MG per tablet   Oral   Take 1-2 tablets by mouth every 4 (four) hours as needed for pain.   23 tablet   0   . pantoprazole (PROTONIX) 40 MG tablet   Oral   Take 40 mg by mouth 2 (two) times daily.         . promethazine (PHENERGAN) 25 MG tablet   Oral   Take 0.5-1 tablets (12.5-25 mg total) by mouth every 6 (six) hours as needed for nausea.   30 tablet   0   . propranolol (INDERAL) 60 MG tablet   Oral   Take 60 mg by mouth 2 (two) times daily.          BP 165/105  Pulse 94  Temp(Src) 97.6 F (36.4 C) (Oral)   Resp 18  Ht 5\' 2"  (1.575 m)  Wt 220 lb (99.791 kg)  BMI 40.23 kg/m2  SpO2 99% Physical Exam  Nursing note reviewed. Constitutional: She is oriented to person, place, and time. She appears well-developed and well-nourished.  Cardiovascular: Normal rate and regular rhythm.   Pulmonary/Chest: Effort normal and breath sounds normal.  Musculoskeletal: Normal range of motion.  Neurological: She is alert and oriented to person, place, and time.  Skin:  Tender over the left chest wall and shoulder    ED Course  Procedures (including critical care time) Labs Review Labs Reviewed  TROPONIN I   Imaging Review Dg Chest 2 View  01/02/2013   CLINICAL DATA:  Left chest pain radiating to left shoulder and neck  EXAM: CHEST  2 VIEW  COMPARISON:  10/24/2012  FINDINGS: Enlargement of cardiac silhouette post median sternotomy.  Mediastinal contours and pulmonary vascularity normal.  Lungs clear.  No pleural effusion or pneumothorax.  Bones unremarkable.  IMPRESSION: No acute abnormalities.  Enlargement of cardiac silhouette.   Electronically Signed   By: Ulyses Southward M.D.   On: 01/02/2013 15:42    EKG Interpretation   None       MDM   1. Chest wall pain     Doubt cardiac in nature:sounds more neuropathic:pt can follow up with pcp:pt has been seen by cardiology in the past with these episodes and they say there is  Nothing they can do     Teressa Lower, NP 01/02/13 1827

## 2013-01-02 NOTE — ED Provider Notes (Signed)
Medical screening examination/treatment/procedure(s) were performed by non-physician practitioner and as supervising physician I was immediately available for consultation/collaboration.  EKG Interpretation    Date/Time:  Tuesday January 02 2013 15:17:19 EST Ventricular Rate:  94 PR Interval:  126 QRS Duration: 88 QT Interval:  386 QTC Calculation: 482 R Axis:   72 Text Interpretation:  Normal sinus rhythm Nonspecific T wave abnormality Prolonged QT Abnormal ECG Confirmed by Malva Cogan  MD, Radley Barto ([id number]) on 01/02/2013 6:01:41 PM             Geoffery Lyons, MD 01/02/13 2306

## 2013-01-06 ENCOUNTER — Emergency Department (HOSPITAL_BASED_OUTPATIENT_CLINIC_OR_DEPARTMENT_OTHER)
Admission: EM | Admit: 2013-01-06 | Discharge: 2013-01-06 | Disposition: A | Discharge status: Home / Self Care | Payer: Medicaid Other | Attending: Emergency Medicine | Admitting: Emergency Medicine

## 2013-01-06 ENCOUNTER — Encounter (HOSPITAL_BASED_OUTPATIENT_CLINIC_OR_DEPARTMENT_OTHER): Payer: Self-pay | Admitting: Emergency Medicine

## 2013-01-06 DIAGNOSIS — R071 Chest pain on breathing: Secondary | ICD-10-CM | POA: Insufficient documentation

## 2013-01-06 DIAGNOSIS — I456 Pre-excitation syndrome: Secondary | ICD-10-CM | POA: Insufficient documentation

## 2013-01-06 DIAGNOSIS — Z85528 Personal history of other malignant neoplasm of kidney: Secondary | ICD-10-CM | POA: Insufficient documentation

## 2013-01-06 DIAGNOSIS — R0789 Other chest pain: Secondary | ICD-10-CM

## 2013-01-06 DIAGNOSIS — K219 Gastro-esophageal reflux disease without esophagitis: Secondary | ICD-10-CM | POA: Insufficient documentation

## 2013-01-06 DIAGNOSIS — Z87442 Personal history of urinary calculi: Secondary | ICD-10-CM | POA: Insufficient documentation

## 2013-01-06 DIAGNOSIS — Z79899 Other long term (current) drug therapy: Secondary | ICD-10-CM | POA: Insufficient documentation

## 2013-01-06 DIAGNOSIS — G43909 Migraine, unspecified, not intractable, without status migrainosus: Secondary | ICD-10-CM | POA: Insufficient documentation

## 2013-01-06 DIAGNOSIS — Z9889 Other specified postprocedural states: Secondary | ICD-10-CM | POA: Insufficient documentation

## 2013-01-06 DIAGNOSIS — Z950 Presence of cardiac pacemaker: Secondary | ICD-10-CM | POA: Insufficient documentation

## 2013-01-06 MED ORDER — HYDROCODONE-ACETAMINOPHEN 5-325 MG PO TABS: 2.0000 | ORAL_TABLET | Freq: Once | ORAL | Status: DC

## 2013-01-06 MED ORDER — ONDANSETRON 8 MG PO TBDP
8.0000 mg | ORAL_TABLET | Freq: Once | ORAL | Status: AC
  Administered 2013-01-06: 8 mg via ORAL
  Filled 2013-01-06: qty 1

## 2013-01-06 MED ORDER — OXYCODONE-ACETAMINOPHEN 5-325 MG PO TABS: 2.0000 | ORAL_TABLET | ORAL | Status: DC | PRN

## 2013-01-06 MED ORDER — OXYCODONE-ACETAMINOPHEN 5-325 MG PO TABS
2.0000 | ORAL_TABLET | Freq: Once | ORAL | Status: AC
  Administered 2013-01-06: 2 via ORAL
  Filled 2013-01-06: qty 2

## 2013-01-06 NOTE — ED Provider Notes (Signed)
CSN: [id number]     Arrival date & time 01/06/13  1108 History   First MD Initiated Contact with Patient 01/06/13 1138     Chief Complaint  Patient presents with  . Shoulder Pain   (Consider location/radiation/quality/duration/timing/severity/associated sxs/prior Treatment) HPI 57 year old female has a gradual onset one and a half week history of a constant left upper chest wall pain 24/7 worse with palpation and torso position changes and nonexertional nonpleuritic without cough fever shortness of breath without neck pain shoulder joint pain weakness or numbness of her left arm or radiation down her left arm or other concerns. She had unremarkable troponin EKG and chest x-ray for this already and has been seen by the emergency department and her primary care Dr. has been referred to a neurologist within the next week. She has had partial improvement with Percocet. She's had no fever no rash no redness to her skin. Past Medical History  Diagnosis Date  . Migraine   . Kidney stones   . Gastroparesis   . FHx: kidney cancer   . Acid reflux   . WPW (Wolff-Parkinson-White syndrome)   . Pacemaker    Past Surgical History  Procedure Laterality Date  . Other surgical history      open heart surgery - repair hole in heart  . Kidney surgery    . Stomach surgery    . Cholecystectomy    . Pacemaker insertion    . Pacemaker removal    . Abdominal hysterectomy    . Cardiac surgery    . Av node ablation    . Hemorrhoid surgery     No family history on file. History  Substance Use Topics  . Smoking status: Never Smoker   . Smokeless tobacco: Not on file  . Alcohol Use: No   OB History   Grav Para Term Preterm Abortions TAB SAB Ect Mult Living                 Review of Systems 10 Systems reviewed and are negative for acute change except as noted in the HPI. Allergies  Compazine; Ketorolac tromethamine; Morphine and related; Nsaids; Oxycodone; Toradol; Tramadol; and Demerol  Home  Medications   Current Outpatient Rx  Name  Route  Sig  Dispense  Refill  . acetaminophen (TYLENOL) 500 MG tablet   Oral   Take 1,500 mg by mouth every 6 (six) hours as needed. Patient used this medication for pain today at 3 pm.         . dexlansoprazole (DEXILANT) 60 MG capsule   Oral   Take 60 mg by mouth 2 (two) times daily.           . flecainide (TAMBOCOR) 100 MG tablet   Oral   Take 100 mg by mouth 2 (two) times daily.         . flecainide (TAMBOCOR) 100 MG tablet   Oral   Take 100 mg by mouth 2 (two) times daily as needed.         . gabapentin (NEURONTIN) 300 MG capsule   Oral   Take 900 mg by mouth 3 (three) times daily.          Marland Kitchen HYDROcodone-acetaminophen (NORCO) 5-325 MG per tablet   Oral   Take 2 tablets by mouth every 4 (four) hours as needed for pain.   20 tablet   0   . lisinopril (PRINIVIL,ZESTRIL) 20 MG tablet   Oral   Take 20 mg by mouth 2 (  two) times daily.         . nitroGLYCERIN (NITRODUR - DOSED IN MG/24 HR) 0.4 mg/hr   Transdermal   Place 1 patch onto the skin as needed.           . ondansetron (ZOFRAN ODT) 8 MG disintegrating tablet      8mg  ODT q4 hours prn nausea   10 tablet   0   . ondansetron (ZOFRAN-ODT) 8 MG disintegrating tablet   Oral   Take 8 mg by mouth every 8 (eight) hours as needed.           Marland Kitchen oxyCODONE-acetaminophen (PERCOCET) 5-325 MG per tablet   Oral   Take 2 tablets by mouth every 4 (four) hours as needed.   20 tablet   0   . oxyCODONE-acetaminophen (PERCOCET/ROXICET) 5-325 MG per tablet   Oral   Take 1-2 tablets by mouth every 4 (four) hours as needed for pain.   23 tablet   0   . oxyCODONE-acetaminophen (PERCOCET/ROXICET) 5-325 MG per tablet   Oral   Take 1-2 tablets by mouth every 4 (four) hours as needed for severe pain.   15 tablet   0   . pantoprazole (PROTONIX) 40 MG tablet   Oral   Take 40 mg by mouth 2 (two) times daily.         . promethazine (PHENERGAN) 25 MG tablet   Oral    Take 0.5-1 tablets (12.5-25 mg total) by mouth every 6 (six) hours as needed for nausea.   30 tablet   0   . propranolol (INDERAL) 60 MG tablet   Oral   Take 60 mg by mouth 2 (two) times daily.          BP 176/96  Pulse 79  Temp(Src) 97.5 F (36.4 C) (Oral)  Resp 20  SpO2 98% Physical Exam  Nursing note and vitals reviewed. Constitutional:  Awake, alert, nontoxic appearance.  HENT:  Head: Atraumatic.  Eyes: Right eye exhibits no discharge. Left eye exhibits no discharge.  Neck: Neck supple.  Cardiovascular: Normal rate and regular rhythm.   No murmur heard. Pulmonary/Chest: Effort normal and breath sounds normal. No respiratory distress. She has no wheezes. She has no rales. She exhibits tenderness.  Left upper chest wall reproducible tenderness without erythema induration fluctuance or rash  Abdominal: Soft. There is no tenderness. There is no rebound.  Musculoskeletal: She exhibits no tenderness.  Baseline ROM, no obvious new focal weakness. Cervical spine neck left shoulder joints all nontender. Left arm nontender with good range of motion including abduction of the shoulder normal light touch to the deltoid area left hand has intact light touch as well as strength in the distributions of the median radial and ulnar nerve function with capillary refill less than 2 seconds.  Neurological:  Mental status and motor strength appears baseline for patient and situation.  Skin: No rash noted.  Psychiatric: She has a normal mood and affect.    ED Course  Procedures (including critical care time) Patient informed of clinical course, understand medical decision-making process, and agree with plan. Labs Review Labs Reviewed - No data to display Imaging Review No results found.  EKG Interpretation   None       MDM   1. Chest wall pain    I doubt any other EMC precluding discharge at this time including, but not necessarily limited to the following:ACS.    Hurman Horn, MD 01/06/13 2005

## 2013-01-06 NOTE — ED Notes (Signed)
Patient states that she was here Tuesday for left shoulder pain and was tested and had xrays taken, but nothing was found and was told to follow up with PCP. Her PCP told her to follow with neurology. She has an appt on Tuesday and the nurse told her to "double up on her pain medication" until she came in to see the dr. Patient states she ran out of medication this morning.

## 2013-03-30 ENCOUNTER — Emergency Department (HOSPITAL_BASED_OUTPATIENT_CLINIC_OR_DEPARTMENT_OTHER)
Admission: EM | Admit: 2013-03-30 | Discharge: 2013-03-30 | Disposition: A | Discharge status: Other Acute Inpatient Hospital | Payer: Medicaid Other | Attending: Emergency Medicine | Admitting: Emergency Medicine

## 2013-03-30 ENCOUNTER — Emergency Department (HOSPITAL_BASED_OUTPATIENT_CLINIC_OR_DEPARTMENT_OTHER): Payer: Medicaid Other

## 2013-03-30 ENCOUNTER — Encounter (HOSPITAL_BASED_OUTPATIENT_CLINIC_OR_DEPARTMENT_OTHER): Payer: Self-pay | Admitting: Emergency Medicine

## 2013-03-30 DIAGNOSIS — R0602 Shortness of breath: Secondary | ICD-10-CM | POA: Insufficient documentation

## 2013-03-30 DIAGNOSIS — G43909 Migraine, unspecified, not intractable, without status migrainosus: Secondary | ICD-10-CM | POA: Insufficient documentation

## 2013-03-30 DIAGNOSIS — K219 Gastro-esophageal reflux disease without esophagitis: Secondary | ICD-10-CM | POA: Insufficient documentation

## 2013-03-30 DIAGNOSIS — Z950 Presence of cardiac pacemaker: Secondary | ICD-10-CM | POA: Insufficient documentation

## 2013-03-30 DIAGNOSIS — R110 Nausea: Secondary | ICD-10-CM | POA: Insufficient documentation

## 2013-03-30 DIAGNOSIS — R079 Chest pain, unspecified: Secondary | ICD-10-CM | POA: Insufficient documentation

## 2013-03-30 DIAGNOSIS — R000 Tachycardia, unspecified: Secondary | ICD-10-CM | POA: Insufficient documentation

## 2013-03-30 DIAGNOSIS — Z87442 Personal history of urinary calculi: Secondary | ICD-10-CM | POA: Insufficient documentation

## 2013-03-30 DIAGNOSIS — Z79899 Other long term (current) drug therapy: Secondary | ICD-10-CM | POA: Insufficient documentation

## 2013-03-30 DIAGNOSIS — Z8679 Personal history of other diseases of the circulatory system: Secondary | ICD-10-CM | POA: Insufficient documentation

## 2013-03-30 DIAGNOSIS — R42 Dizziness and giddiness: Secondary | ICD-10-CM | POA: Insufficient documentation

## 2013-03-30 LAB — BASIC METABOLIC PANEL
BUN: 18 mg/dL (ref 6–23)
CALCIUM: 9.6 mg/dL (ref 8.4–10.5)
CO2: 26 mEq/L (ref 19–32)
CREATININE: 0.9 mg/dL (ref 0.50–1.10)
Chloride: 102 mEq/L (ref 96–112)
GFR calc Af Amer: 81 mL/min — ABNORMAL LOW (ref >90)
GFR, EST NON AFRICAN AMERICAN: 70 mL/min — AB (ref >90)
GLUCOSE: 100 mg/dL — AB (ref 70–99)
POTASSIUM: 3.8 meq/L (ref 3.7–5.3)
Sodium: 143 mEq/L (ref 137–147)

## 2013-03-30 LAB — CBC WITH DIFFERENTIAL/PLATELET
BASOS PCT: 0 % (ref 0–1)
Basophils Absolute: 0 10*3/uL (ref 0.0–0.1)
EOS ABS: 0.1 10*3/uL (ref 0.0–0.7)
Eosinophils Relative: 1 % (ref 0–5)
HCT: 41.7 % (ref 36.0–46.0)
HEMOGLOBIN: 13.9 g/dL (ref 12.0–15.0)
Lymphocytes Relative: 33 % (ref 12–46)
Lymphs Abs: 2.6 10*3/uL (ref 0.7–4.0)
MCH: 32.3 pg (ref 26.0–34.0)
MCHC: 33.3 g/dL (ref 30.0–36.0)
MCV: 97 fL (ref 78.0–100.0)
MONOS PCT: 10 % (ref 3–12)
Monocytes Absolute: 0.8 10*3/uL (ref 0.1–1.0)
NEUTROS PCT: 56 % (ref 43–77)
Neutro Abs: 4.4 10*3/uL (ref 1.7–7.7)
Platelets: 175 10*3/uL (ref 150–400)
RBC: 4.3 MIL/uL (ref 3.87–5.11)
RDW: 13.4 % (ref 11.5–15.5)
WBC: 7.9 10*3/uL (ref 4.0–10.5)

## 2013-03-30 LAB — TROPONIN I: Troponin I: <0.3 ng/mL (ref <0.30)

## 2013-03-30 MED ORDER — LORAZEPAM 2 MG/ML IJ SOLN
1.0000 mg | Freq: Once | INTRAMUSCULAR | Status: AC
  Administered 2013-03-30: 1 mg via INTRAVENOUS
  Filled 2013-03-30: qty 1

## 2013-03-30 MED ORDER — METOPROLOL TARTRATE 1 MG/ML IV SOLN
5.0000 mg | Freq: Once | INTRAVENOUS | Status: AC
  Administered 2013-03-30: 5 mg via INTRAVENOUS
  Filled 2013-03-30: qty 5

## 2013-03-30 MED ORDER — ONDANSETRON 8 MG PO TBDP
8.0000 mg | ORAL_TABLET | Freq: Once | ORAL | Status: AC
Start: 2013-03-30 — End: 2013-03-30
  Administered 2013-03-30: 8 mg via ORAL
  Filled 2013-03-30: qty 1

## 2013-03-30 MED ORDER — ONDANSETRON HCL 4 MG/2ML IJ SOLN
4.0000 mg | Freq: Once | INTRAMUSCULAR | Status: AC
Start: 2013-03-30 — End: 2013-03-30
  Administered 2013-03-30: 4 mg via INTRAVENOUS
  Filled 2013-03-30: qty 2

## 2013-03-30 MED ORDER — SODIUM CHLORIDE 0.9 % IV BOLUS (SEPSIS)
1000.0000 mL | Freq: Once | INTRAVENOUS | Status: AC
  Administered 2013-03-30: 1000 mL via INTRAVENOUS

## 2013-03-30 NOTE — ED Notes (Signed)
Copy of CT went to [facility]

## 2013-03-30 NOTE — ED Provider Notes (Signed)
CSN: [id number]     Arrival date & time 03/30/13  1257 History   First MD Initiated Contact with Patient 03/30/13 1321     Chief Complaint  Patient presents with  . Chest Pain      Patient is a 58 y.o. female presenting with chest pain. The history is provided by the patient.  Chest Pain Pain location:  L chest Pain quality: pressure   Pain radiates to:  Does not radiate Pain severity:  Moderate Onset quality:  Gradual Duration:  2 hours Timing:  Intermittent Progression:  Unchanged Relieved by:  Nitroglycerin Worsened by:  Exertion Associated symptoms: dizziness, nausea and shortness of breath   PT reports she started having left chest pressure about 2 hrs ago.  It is worse with exertion and improved with rest. 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 Migraine   . Kidney stones   . Gastroparesis   . FHx: kidney cancer   . Acid reflux   . WPW (Wolff-Parkinson-White syndrome)   . Pacemaker    Past Surgical History  Procedure Laterality Date  . Other surgical history      open heart surgery - repair hole in heart  . Kidney surgery    . Stomach surgery    . Cholecystectomy    . Pacemaker insertion    . Pacemaker removal    . Abdominal hysterectomy    . Cardiac surgery    . Av node ablation    . Hemorrhoid surgery     No family history on file. History  Substance Use Topics  . Smoking status: Never Smoker   . Smokeless tobacco: Not on file  . Alcohol Use: No   OB History   Grav Para Term Preterm Abortions TAB SAB Ect Mult Living                 Review of Systems  Respiratory: Positive for shortness of breath.   Cardiovascular: Positive for chest pain.  Gastrointestinal: Positive for nausea.  Neurological:  Positive for dizziness.  All other systems reviewed and are negative.      Allergies  Compazine; Ketorolac tromethamine; Morphine and related; Nsaids; Oxycodone; Toradol; Tramadol; and Demerol  Home Medications   Current Outpatient Rx  Name  Route  Sig  Dispense  Refill  . Albuterol (VENTOLIN IN)   Inhalation   Inhale into the lungs.         . Budesonide-Formoterol Fumarate (SYMBICORT IN)   Inhalation   Inhale into the lungs.         . lidocaine (LIDODERM) 5 %   Transdermal   Place 1 patch onto the skin daily. Remove & Discard patch within 12 hours or as directed by MD         . losartan (COZAAR) 100 MG tablet   Oral   Take 100 mg by mouth daily.         Marland Kitchen acetaminophen (TYLENOL) 500 MG tablet   Oral   Take 1,500 mg by mouth every 6 (six) hours as needed. Patient used this medication for pain today at 3 pm.         . dexlansoprazole (DEXILANT) 60 MG capsule   Oral   Take 60 mg  by mouth 2 (two) times daily.           . flecainide (TAMBOCOR) 100 MG tablet   Oral   Take 100 mg by mouth 2 (two) times daily.         . flecainide (TAMBOCOR) 100 MG tablet   Oral   Take 100 mg by mouth 2 (two) times daily as needed.         . gabapentin (NEURONTIN) 300 MG capsule   Oral   Take 900 mg by mouth 3 (three) times daily.          Marland Kitchen HYDROcodone-acetaminophen (NORCO) 5-325 MG per tablet   Oral   Take 2 tablets by mouth every 4 (four) hours as needed for pain.   20 tablet   0   . lisinopril (PRINIVIL,ZESTRIL) 20 MG tablet   Oral   Take 20 mg by mouth 2 (two) times daily.         . nitroGLYCERIN (NITRODUR - DOSED IN MG/24 HR) 0.4 mg/hr   Transdermal   Place 1 patch onto the skin as needed.           . ondansetron (ZOFRAN ODT) 8 MG disintegrating tablet      8mg  ODT q4 hours prn nausea   10 tablet   0   . ondansetron (ZOFRAN-ODT) 8 MG disintegrating tablet   Oral   Take 8 mg by mouth every 8 (eight) hours as needed.           Marland Kitchen  oxyCODONE-acetaminophen (PERCOCET) 5-325 MG per tablet   Oral   Take 2 tablets by mouth every 4 (four) hours as needed.   20 tablet   0   . oxyCODONE-acetaminophen (PERCOCET/ROXICET) 5-325 MG per tablet   Oral   Take 1-2 tablets by mouth every 4 (four) hours as needed for pain.   23 tablet   0   . oxyCODONE-acetaminophen (PERCOCET/ROXICET) 5-325 MG per tablet   Oral   Take 1-2 tablets by mouth every 4 (four) hours as needed for severe pain.   15 tablet   0   . pantoprazole (PROTONIX) 40 MG tablet   Oral   Take 40 mg by mouth 2 (two) times daily.         . promethazine (PHENERGAN) 25 MG tablet   Oral   Take 0.5-1 tablets (12.5-25 mg total) by mouth every 6 (six) hours as needed for nausea.   30 tablet   0   . propranolol (INDERAL) 60 MG tablet   Oral   Take 60 mg by mouth 2 (two) times daily.          BP 162/81  Pulse 101  Temp(Src) 97.8 F (36.6 C) (Oral)  Resp 20  Ht 5\' 2"  (1.575 m)  Wt 213 lb (96.616 kg)  BMI 38.95 kg/m2  SpO2 100% BP 141/93  Pulse 127  Temp(Src) 97.8 F (36.6 C) (Oral)  Resp 20  Ht 5\' 2"  (1.575 m)  Wt 213 lb (96.616 kg)  BMI 38.95 kg/m2  SpO2 99%  Physical Exam CONSTITUTIONAL: Well developed/well nourished HEAD: Normocephalic/atraumatic EYES: EOMI/PERRL ENMT: Mucous membranes moist NECK: supple no meningeal signs SPINE:entire spine nontender CV: tachycardic ,S1/S2 noted, no murmurs/rubs/gallops noted LUNGS: Lungs are clear to auscultation bilaterally, no apparent distress ABDOMEN: soft, nontender, no rebound or guarding GU:no cva tenderness NEURO: Pt is awake/alert, moves all extremitiesx4 EXTREMITIES: pulses normal, full ROM, no calf tenderness, no LE edema SKIN: warm, color normal PSYCH: anxious  ED Course  Procedures   Pt reports she started having chest pressure earlier today She took ASA and NTG with improvement While in the ED she developed significant narrow complex tachycardia (140s) She has reported h/o WPW -  she is already on flecainide/inderal and reports med compliance  I gave her ativan/zofran/lopressor and her HR is improved  I spoke to her cardiology Dr Minna Merritts with [facility].  He knows pt well.  He reports she will have frequent episodes of tachycardia However, her chest pain is concerning.  Per his records no recent cardiac cath, and she has had frequent ER visits for Chest pain.  I feel she should be admitted for further cardiac monitoring and ACS rule out.  Dr Minna Merritts to accept in transfer to [facility] and will arrange for bed Pt agreeable with plan for transfer  [place] - Abnormal; Notable for the following:    Glucose, Bld 100 (*)    GFR calc non Af Amer 70 (*)    GFR calc Af Amer 81 (*)    All other components within normal limits  CBC WITH DIFFERENTIAL  TROPONIN I   Imaging Review Dg Chest 2 View  03/30/2013   CLINICAL DATA:  Chest pain and nausea. History of renal cell carcinoma.  EXAM: CHEST  2 VIEW  COMPARISON:  PA and lateral chest 06/19/2011 and single view of the chest 02/25/2013.  FINDINGS: The lungs are clear. The patient is status post CABG. Heart size is normal. No pneumothorax or pleural fluid. Cholecystectomy clips noted.  IMPRESSION: Negative for metastatic or acute disease.   Electronically Signed   By: Inge Rise M.D.   On: 03/30/2013 13:37     Date: 03/30/2013  Rate: 96  Rhythm: normal sinus rhythm  QRS Axis: normal  Intervals: normal  ST/T Wave abnormalities: nonspecific T wave changes  Conduction Disutrbances:none   EKG Interpretation  Date/Time:  Friday March 30 2013 15:09:39 EST Ventricular Rate:  90 PR Interval:  128 QRS Duration: 84 QT Interval:  376 QTC Calculation: 459 R Axis:   58 Text Interpretation:  Normal sinus rhythm Normal ECG HR improved from prior Confirmed by Christy Gentles  MD, Elenore Rota ([id number]) on 03/30/2013 3:12:04 PM        MDM   Final diagnoses:  Chest pain   Tachycardia    Nursing notes including past medical history and social history reviewed and considered in documentation xrays reviewed and considered Previous records reviewed and considered Labs/vital reviewed and considered     Sharyon Cable, MD 03/30/13 [phone]

## 2013-03-30 NOTE — ED Notes (Signed)
C/o CP x 15min PTA

## 2013-03-30 NOTE — ED Notes (Signed)
Going to [id number]

## 2013-03-30 NOTE — ED Notes (Signed)
Paged Dr. Bevelyn Ngo for Dr. Ramon Dredge on [phone]

## 2013-06-23 ENCOUNTER — Emergency Department (HOSPITAL_BASED_OUTPATIENT_CLINIC_OR_DEPARTMENT_OTHER)
Admission: EM | Admit: 2013-06-23 | Discharge: 2013-06-23 | Disposition: A | Discharge status: Home / Self Care | Payer: Medicaid Other | Attending: Emergency Medicine | Admitting: Emergency Medicine

## 2013-06-23 ENCOUNTER — Emergency Department (HOSPITAL_BASED_OUTPATIENT_CLINIC_OR_DEPARTMENT_OTHER): Payer: Medicaid Other

## 2013-06-23 ENCOUNTER — Encounter (HOSPITAL_BASED_OUTPATIENT_CLINIC_OR_DEPARTMENT_OTHER): Payer: Self-pay | Admitting: Emergency Medicine

## 2013-06-23 DIAGNOSIS — K3184 Gastroparesis: Secondary | ICD-10-CM | POA: Insufficient documentation

## 2013-06-23 DIAGNOSIS — M25519 Pain in unspecified shoulder: Secondary | ICD-10-CM | POA: Insufficient documentation

## 2013-06-23 DIAGNOSIS — Z79899 Other long term (current) drug therapy: Secondary | ICD-10-CM | POA: Insufficient documentation

## 2013-06-23 DIAGNOSIS — R000 Tachycardia, unspecified: Secondary | ICD-10-CM | POA: Insufficient documentation

## 2013-06-23 DIAGNOSIS — G43909 Migraine, unspecified, not intractable, without status migrainosus: Secondary | ICD-10-CM | POA: Insufficient documentation

## 2013-06-23 DIAGNOSIS — Z8679 Personal history of other diseases of the circulatory system: Secondary | ICD-10-CM | POA: Insufficient documentation

## 2013-06-23 DIAGNOSIS — Z950 Presence of cardiac pacemaker: Secondary | ICD-10-CM | POA: Insufficient documentation

## 2013-06-23 DIAGNOSIS — Z87442 Personal history of urinary calculi: Secondary | ICD-10-CM | POA: Insufficient documentation

## 2013-06-23 DIAGNOSIS — K219 Gastro-esophageal reflux disease without esophagitis: Secondary | ICD-10-CM | POA: Insufficient documentation

## 2013-06-23 DIAGNOSIS — Z9889 Other specified postprocedural states: Secondary | ICD-10-CM | POA: Insufficient documentation

## 2013-06-23 MED ORDER — OXYCODONE-ACETAMINOPHEN 5-325 MG PO TABS
1.0000 | ORAL_TABLET | Freq: Once | ORAL | Status: AC
  Administered 2013-06-23: 1 via ORAL
  Filled 2013-06-23: qty 1

## 2013-06-23 MED ORDER — OXYCODONE-ACETAMINOPHEN 5-325 MG PO TABS: 1.0000 | ORAL_TABLET | Freq: Four times a day (QID) | ORAL | Status: DC | PRN

## 2013-06-23 NOTE — ED Notes (Signed)
Help Pt adjust her own shoulder immobilizer to the Lt shoulder

## 2013-06-23 NOTE — ED Provider Notes (Addendum)
CSN: [id number]     Arrival date & time 06/23/13  0940 History   First MD Initiated Contact with Patient 06/23/13 1009     Chief Complaint  Patient presents with  . Shoulder Pain     (Consider location/radiation/quality/duration/timing/severity/associated sxs/prior Treatment) Patient is a 58 y.o. female presenting with shoulder pain. The history is provided by the patient.  Shoulder Pain This is a new problem. Episode onset: 4 days. The problem occurs constantly. The problem has been gradually worsening. Associated symptoms comments: Pt recently had surgery to the left shoulder on May 6th and 4 days ago was in the shower and took off her sling and reached down to the trash can and thinks she abducted to far and since that time has had searing pain that starts in the left shoulder and radiates down to her fingers.. Exacerbated by: movement. Nothing relieves the symptoms. Treatments tried: ice and tylenol. The treatment provided no relief.    Past Medical History  Diagnosis Date  . Migraine   . Kidney stones   . Gastroparesis   . FHx: kidney cancer   . Acid reflux   . WPW (Wolff-Parkinson-White syndrome)   . Pacemaker    Past Surgical History  Procedure Laterality Date  . Other surgical history      open heart surgery - repair hole in heart  . Kidney surgery    . Stomach surgery    . Cholecystectomy    . Pacemaker insertion    . Pacemaker removal    . Abdominal hysterectomy    . Cardiac surgery    . Av node ablation    . Hemorrhoid surgery     No family history on file. History  Substance Use Topics  . Smoking status: Never Smoker   . Smokeless tobacco: Not on file  . Alcohol Use: No   OB History   Grav Para Term Preterm Abortions TAB SAB Ect Mult Living                 Review of Systems  All other systems reviewed and are negative.     Allergies  Compazine; Ketorolac tromethamine; Morphine and related; Nsaids; Oxycodone; Toradol; Tramadol; and Demerol  Home  Medications   Prior to Admission medications   Medication Sig Start Date End Date Taking? Authorizing Provider  acetaminophen (TYLENOL) 500 MG tablet Take 1,500 mg by mouth every 6 (six) hours as needed. Patient used this medication for pain today at 3 pm.    Historical Provider, MD  Albuterol (VENTOLIN IN) Inhale into the lungs.    Historical Provider, MD  Budesonide-Formoterol Fumarate (SYMBICORT IN) Inhale into the lungs.    Historical Provider, MD  dexlansoprazole (DEXILANT) 60 MG capsule Take 60 mg by mouth 2 (two) times daily.      Historical Provider, MD  flecainide (TAMBOCOR) 100 MG tablet Take 100 mg by mouth 2 (two) times daily.    Historical Provider, MD  flecainide (TAMBOCOR) 100 MG tablet Take 100 mg by mouth 2 (two) times daily as needed.    Historical Provider, MD  gabapentin (NEURONTIN) 300 MG capsule Take 900 mg by mouth 3 (three) times daily.     Historical Provider, MD  HYDROcodone-acetaminophen (NORCO) 5-325 MG per tablet Take 2 tablets by mouth every 4 (four) hours as needed for pain. 10/24/11   Babette Relic, MD  lidocaine (LIDODERM) 5 % Place 1 patch onto the skin daily. Remove & Discard patch within 12 hours or as directed by  MD    Historical Provider, MD  lisinopril (PRINIVIL,ZESTRIL) 20 MG tablet Take 20 mg by mouth 2 (two) times daily.    Historical Provider, MD  losartan (COZAAR) 100 MG tablet Take 100 mg by mouth daily.    Historical Provider, MD  nitroGLYCERIN (NITRODUR - DOSED IN MG/24 HR) 0.4 mg/hr Place 1 patch onto the skin as needed.      Historical Provider, MD  ondansetron (ZOFRAN ODT) 8 MG disintegrating tablet 8mg  ODT q4 hours prn nausea 10/24/11   Babette Relic, MD  ondansetron (ZOFRAN-ODT) 8 MG disintegrating tablet Take 8 mg by mouth every 8 (eight) hours as needed.      Historical Provider, MD  oxyCODONE-acetaminophen (PERCOCET) 5-325 MG per tablet Take 2 tablets by mouth every 4 (four) hours as needed. 01/06/13   Babette Relic, MD  oxyCODONE-acetaminophen  (PERCOCET/ROXICET) 5-325 MG per tablet Take 1-2 tablets by mouth every 4 (four) hours as needed for pain. 10/27/11   John-Adam Bonk, MD  oxyCODONE-acetaminophen (PERCOCET/ROXICET) 5-325 MG per tablet Take 1-2 tablets by mouth every 4 (four) hours as needed for severe pain. 01/02/13   Glendell Docker, NP  pantoprazole (PROTONIX) 40 MG tablet Take 40 mg by mouth 2 (two) times daily.    Historical Provider, MD  promethazine (PHENERGAN) 25 MG tablet Take 0.5-1 tablets (12.5-25 mg total) by mouth every 6 (six) hours as needed for nausea. 10/27/11   John-Adam Bonk, MD  propranolol (INDERAL) 60 MG tablet Take 60 mg by mouth 2 (two) times daily.    Historical Provider, MD   BP 173/90  Pulse 119  Temp(Src) 98 F (36.7 C) (Oral)  Resp 18  SpO2 97% Physical Exam  Nursing note and vitals reviewed. Constitutional: She is oriented to person, place, and time. She appears well-developed and well-nourished. She appears distressed.  Appears in pain  HENT:  Head: Normocephalic and atraumatic.  Eyes: EOM are normal. Pupils are equal, round, and reactive to light.  Cardiovascular: Tachycardia present.   Pulmonary/Chest: Effort normal.  Musculoskeletal:       Arms: Neurological: She is alert and oriented to person, place, and time.  Skin: Skin is warm and dry. No rash noted. No erythema.  Psychiatric: She has a normal mood and affect. Her behavior is normal.    ED Course  Procedures (including critical care time) Labs Review Labs Reviewed - No data to display  Imaging Review Dg Shoulder 1v Left  06/23/2013   CLINICAL DATA:  Left shoulder pain after recent surgery  EXAM: LEFT SHOULDER - 1 VIEW  COMPARISON:  06/18/2013  FINDINGS: No fracture or dislocation is seen.  The joint spaces are preserved.  The visualized soft tissues are unremarkable.  Visualized left lung is clear.  IMPRESSION: No acute osseus abnormality is seen.   Electronically Signed   By: Julian Hy M.D.   On: 06/23/2013 10:57      EKG Interpretation None      MDM   Final diagnoses:  Shoulder pain    Patient with recent shoulder surgery at the beginning of May who 4 days ago took off her sling and reached down to the trash can and felt severe pain going through the left shoulder. Since that time she's noticed swelling and worsening pain. She's been icing it continually been taking Tylenol without improvement. At this point she has not spoken with the orthopedic surgeon. On exam patient is neurovascularly intact with notable swelling and warmth over the left shoulder. Plain film is unrevealing.  Patient given pain control and encouraged to followup with her surgeon on Monday    Blanchie Dessert, MD 06/23/13 1114  Blanchie Dessert, MD 06/23/13 1114

## 2013-06-23 NOTE — ED Notes (Signed)
Patient having left shoulder pain. Recently had "slap tear surgery" on that side and a few days ago states she "stretched" her arm too far and now c/o of severe pain.

## 2014-02-23 ENCOUNTER — Emergency Department (HOSPITAL_BASED_OUTPATIENT_CLINIC_OR_DEPARTMENT_OTHER)
Admission: EM | Admit: 2014-02-23 | Discharge: 2014-02-23 | Disposition: A | Discharge status: Home / Self Care | Payer: Medicaid Other | Attending: Emergency Medicine | Admitting: Emergency Medicine

## 2014-02-23 ENCOUNTER — Encounter (HOSPITAL_BASED_OUTPATIENT_CLINIC_OR_DEPARTMENT_OTHER): Payer: Self-pay

## 2014-02-23 DIAGNOSIS — Z87442 Personal history of urinary calculi: Secondary | ICD-10-CM | POA: Diagnosis not present

## 2014-02-23 DIAGNOSIS — Z950 Presence of cardiac pacemaker: Secondary | ICD-10-CM | POA: Diagnosis not present

## 2014-02-23 DIAGNOSIS — K088 Other specified disorders of teeth and supporting structures: Secondary | ICD-10-CM | POA: Diagnosis present

## 2014-02-23 DIAGNOSIS — Z8679 Personal history of other diseases of the circulatory system: Secondary | ICD-10-CM | POA: Insufficient documentation

## 2014-02-23 DIAGNOSIS — Z85520 Personal history of malignant carcinoid tumor of kidney: Secondary | ICD-10-CM | POA: Insufficient documentation

## 2014-02-23 DIAGNOSIS — K0889 Other specified disorders of teeth and supporting structures: Secondary | ICD-10-CM

## 2014-02-23 MED ORDER — HYDROCODONE-ACETAMINOPHEN 5-325 MG PO TABS: 1.0000 | ORAL_TABLET | ORAL | Status: DC | PRN

## 2014-02-23 MED ORDER — PENICILLIN V POTASSIUM 500 MG PO TABS: 500.0000 mg | ORAL_TABLET | Freq: Three times a day (TID) | ORAL | Status: DC

## 2014-02-23 NOTE — ED Notes (Signed)
RX x 2 given at discharge

## 2014-02-23 NOTE — ED Notes (Signed)
Patient here with bottom right broken tooth since Wednesday, now complaining of increased pain and swelling to jaw and gums, taking tylenol with minimal relief

## 2014-02-23 NOTE — Discharge Instructions (Signed)
Return to the ED with any concerns including fever/chills, difficulty breathing or swallowing, vomiting and not able to keep down liquids or antibitoics, decreased level of alertness/lethargy, or any other alarming symtpoms

## 2014-02-23 NOTE — ED Provider Notes (Signed)
CSN: [id number]     Arrival date & time 02/23/14  0741 History   First MD Initiated Contact with Patient 02/23/14 [phone]     Chief Complaint  Patient presents with  . Dental Pain     (Consider location/radiation/quality/duration/timing/severity/associated sxs/prior Treatment) HPI  Pt presenting with dental pain.  Pt states she has pain in right lower molar after the tooth broke off while eating a pretzel several days ago.  Since then the pain has been worsening.  Pain with chewing and liquids.  Pain radiates into right jaw and ear.  No fever/chills.  No difficulty breathing or swallowing.  There are no other associated systemic symptoms, there are no other alleviating or modifying factors.   Past Medical History  Diagnosis Date  . Migraine   . Kidney stones   . Gastroparesis   . FHx: kidney cancer   . Acid reflux   . WPW (Wolff-Parkinson-White syndrome)   . Pacemaker    Past Surgical History  Procedure Laterality Date  . Other surgical history      open heart surgery - repair hole in heart  . Kidney surgery    . Stomach surgery    . Cholecystectomy    . Pacemaker insertion    . Pacemaker removal    . Abdominal hysterectomy    . Cardiac surgery    . Av node ablation    . Hemorrhoid surgery     No family history on file. History  Substance Use Topics  . Smoking status: Never Smoker   . Smokeless tobacco: Not on file  . Alcohol Use: No   OB History    No data available     Review of Systems  ROS reviewed and all otherwise negative except for mentioned in HPI    Allergies  Compazine; Ketorolac tromethamine; Morphine and related; Nsaids; Oxycodone; Toradol; Tramadol; and Demerol  Home Medications   Prior to Admission medications   Medication Sig Start Date End Date Taking? Authorizing Provider  flecainide (TAMBOCOR) 100 MG tablet Take 100 mg by mouth once. Only as needed   Yes Historical Provider, MD  acetaminophen (TYLENOL) 500 MG tablet Take 1,500 mg by mouth  every 6 (six) hours as needed. Patient used this medication for pain today at 3 pm.    Historical Provider, MD  Albuterol (VENTOLIN IN) Inhale into the lungs.    Historical Provider, MD  Budesonide-Formoterol Fumarate (SYMBICORT IN) Inhale into the lungs.    Historical Provider, MD  dexlansoprazole (DEXILANT) 60 MG capsule Take 60 mg by mouth 2 (two) times daily.      Historical Provider, MD  gabapentin (NEURONTIN) 300 MG capsule Take 900 mg by mouth 3 (three) times daily.     Historical Provider, MD  HYDROcodone-acetaminophen (NORCO/VICODIN) 5-325 MG per tablet Take 1-2 tablets by mouth every 4 (four) hours as needed for moderate pain or severe pain. 02/23/14   Threasa Beards, MD  lidocaine (LIDODERM) 5 % Place 1 patch onto the skin daily. Remove & Discard patch within 12 hours or as directed by MD    Historical Provider, MD  lisinopril (PRINIVIL,ZESTRIL) 20 MG tablet Take 20 mg by mouth 2 (two) times daily.    Historical Provider, MD  losartan (COZAAR) 100 MG tablet Take 100 mg by mouth daily.    Historical Provider, MD  nitroGLYCERIN (NITRODUR - DOSED IN MG/24 HR) 0.4 mg/hr Place 1 patch onto the skin as needed.      Historical Provider, MD  ondansetron (ZOFRAN-ODT)  8 MG disintegrating tablet Take 8 mg by mouth every 8 (eight) hours as needed.      Historical Provider, MD  oxyCODONE-acetaminophen (PERCOCET/ROXICET) 5-325 MG per tablet Take 1-2 tablets by mouth every 6 (six) hours as needed for severe pain. 06/23/13   Blanchie Dessert, MD  pantoprazole (PROTONIX) 40 MG tablet Take 40 mg by mouth 2 (two) times daily.    Historical Provider, MD  penicillin v potassium (VEETID) 500 MG tablet Take 1 tablet (500 mg total) by mouth 3 (three) times daily. 02/23/14   Threasa Beards, MD  propranolol (INDERAL) 60 MG tablet Take 60 mg by mouth 2 (two) times daily.    Historical Provider, MD   BP 153/95 mmHg  Pulse 128  Temp(Src) 97.8 F (36.6 C) (Oral)  Resp 20  Ht 5\' 2"  (1.575 m)  Wt 202 lb (91.627 kg)   BMI 36.94 kg/m2  SpO2 95%  Vitals reviewed- pt states her baseline HR is 120- per prior notes she has been 120 in the past Physical Exam  Physical Examination: General appearance - alert, well appearing, and in no distress Mental status - alert, oriented to person, place, and time Eyes - no conjunctival injection, no scleral icterus Mouth - mucous membranes moist, pharynx normal without lesions, broken/eroded tooth in right lower gumline, no surrounding gingival abscess, no swelling under tongue Neck - supple, no significant adenopathy Chest - clear to auscultation, no wheezes, rales or rhonchi, symmetric air entry Heart - normal rate, regular rhythm, normal S1, S2, no murmurs, rubs, clicks or gallops Extremities - peripheral pulses normal, no pedal edema, no clubbing or cyanosis Skin - normal coloration and turgor, no rashes  ED Course  Procedures (including critical care time) Labs Review Labs Reviewed - No data to display  Imaging Review No results found.   EKG Interpretation None      MDM   Final diagnoses:  Pain, dental    Pt presenting with c/o dental pain, tooth broke off while eating a pretzel.  No sign of abscess requiring drainage at this time.  Pt given rx for abx and pain medication. Encouraged to f/u with dentist.  Discharged with strict return precautions.  Pt agreeable with plan.    Threasa Beards, MD 02/23/14 [phone]

## 2014-04-02 ENCOUNTER — Encounter (HOSPITAL_BASED_OUTPATIENT_CLINIC_OR_DEPARTMENT_OTHER): Payer: Self-pay | Admitting: Emergency Medicine

## 2014-04-02 ENCOUNTER — Emergency Department (HOSPITAL_BASED_OUTPATIENT_CLINIC_OR_DEPARTMENT_OTHER)
Admission: EM | Admit: 2014-04-02 | Discharge: 2014-04-02 | Discharge status: Against Medical Advice | Payer: Medicaid Other | Attending: Emergency Medicine | Admitting: Emergency Medicine

## 2014-04-02 DIAGNOSIS — R002 Palpitations: Secondary | ICD-10-CM | POA: Diagnosis not present

## 2014-04-02 NOTE — ED Notes (Signed)
Pt states that she has been having issues with her kidneys and she thinks that is what is triggering her high BP and palpitations.

## 2014-12-13 ENCOUNTER — Encounter (HOSPITAL_BASED_OUTPATIENT_CLINIC_OR_DEPARTMENT_OTHER): Payer: Self-pay | Admitting: *Deleted

## 2014-12-13 ENCOUNTER — Emergency Department (HOSPITAL_BASED_OUTPATIENT_CLINIC_OR_DEPARTMENT_OTHER)
Admission: EM | Admit: 2014-12-13 | Discharge: 2014-12-13 | Disposition: A | Discharge status: Home / Self Care | Payer: Medicaid Other | Attending: Emergency Medicine | Admitting: Emergency Medicine

## 2014-12-13 ENCOUNTER — Emergency Department (HOSPITAL_BASED_OUTPATIENT_CLINIC_OR_DEPARTMENT_OTHER): Payer: Medicaid Other

## 2014-12-13 DIAGNOSIS — Z792 Long term (current) use of antibiotics: Secondary | ICD-10-CM | POA: Diagnosis not present

## 2014-12-13 DIAGNOSIS — J45909 Unspecified asthma, uncomplicated: Secondary | ICD-10-CM | POA: Diagnosis not present

## 2014-12-13 DIAGNOSIS — K219 Gastro-esophageal reflux disease without esophagitis: Secondary | ICD-10-CM | POA: Insufficient documentation

## 2014-12-13 DIAGNOSIS — Z79899 Other long term (current) drug therapy: Secondary | ICD-10-CM | POA: Insufficient documentation

## 2014-12-13 DIAGNOSIS — R42 Dizziness and giddiness: Secondary | ICD-10-CM | POA: Diagnosis not present

## 2014-12-13 DIAGNOSIS — R000 Tachycardia, unspecified: Secondary | ICD-10-CM | POA: Insufficient documentation

## 2014-12-13 DIAGNOSIS — I10 Essential (primary) hypertension: Secondary | ICD-10-CM | POA: Diagnosis not present

## 2014-12-13 DIAGNOSIS — Z950 Presence of cardiac pacemaker: Secondary | ICD-10-CM | POA: Insufficient documentation

## 2014-12-13 DIAGNOSIS — Z859 Personal history of malignant neoplasm, unspecified: Secondary | ICD-10-CM | POA: Insufficient documentation

## 2014-12-13 DIAGNOSIS — Z9889 Other specified postprocedural states: Secondary | ICD-10-CM | POA: Insufficient documentation

## 2014-12-13 DIAGNOSIS — R079 Chest pain, unspecified: Secondary | ICD-10-CM

## 2014-12-13 DIAGNOSIS — R110 Nausea: Secondary | ICD-10-CM | POA: Diagnosis not present

## 2014-12-13 DIAGNOSIS — Z87442 Personal history of urinary calculi: Secondary | ICD-10-CM | POA: Insufficient documentation

## 2014-12-13 DIAGNOSIS — G43909 Migraine, unspecified, not intractable, without status migrainosus: Secondary | ICD-10-CM | POA: Diagnosis not present

## 2014-12-13 HISTORY — DX: Unspecified asthma, uncomplicated: J45.909

## 2014-12-13 HISTORY — DX: Essential (primary) hypertension: I10

## 2014-12-13 HISTORY — DX: Malignant (primary) neoplasm, unspecified: C80.1

## 2014-12-13 LAB — CBC
HCT: 41.4 % (ref 36.0–46.0)
Hemoglobin: 14 g/dL (ref 12.0–15.0)
MCH: 30.9 pg (ref 26.0–34.0)
MCHC: 33.8 g/dL (ref 30.0–36.0)
MCV: 91.4 fL (ref 78.0–100.0)
Platelets: 201 10*3/uL (ref 150–400)
RBC: 4.53 MIL/uL (ref 3.87–5.11)
RDW: 13.1 % (ref 11.5–15.5)
WBC: 8.1 10*3/uL (ref 4.0–10.5)

## 2014-12-13 LAB — BASIC METABOLIC PANEL
Anion gap: 9 (ref 5–15)
BUN: 12 mg/dL (ref 6–20)
CO2: 23 mmol/L (ref 22–32)
Calcium: 9 mg/dL (ref 8.9–10.3)
Chloride: 108 mmol/L (ref 101–111)
Creatinine, Ser: 1 mg/dL (ref 0.44–1.00)
GFR calc Af Amer: >60 mL/min (ref >60)
GFR calc non Af Amer: >60 mL/min (ref >60)
Glucose, Bld: 117 mg/dL — ABNORMAL HIGH (ref 65–99)
Potassium: 4 mmol/L (ref 3.5–5.1)
Sodium: 140 mmol/L (ref 135–145)

## 2014-12-13 LAB — TROPONIN I
Troponin I: <0.03 ng/mL (ref <0.031)
Troponin I: <0.03 ng/mL (ref <0.031)

## 2014-12-13 MED ORDER — IOHEXOL 350 MG/ML SOLN
100.0000 mL | Freq: Once | INTRAVENOUS | Status: AC | PRN
  Administered 2014-12-13: 100 mL via INTRAVENOUS

## 2014-12-13 MED ORDER — HYDROMORPHONE HCL 1 MG/ML IJ SOLN
1.0000 mg | Freq: Once | INTRAMUSCULAR | Status: AC
  Administered 2014-12-13: 1 mg via INTRAVENOUS
  Filled 2014-12-13: qty 1

## 2014-12-13 MED ORDER — PROMETHAZINE HCL 25 MG/ML IJ SOLN
12.5000 mg | Freq: Once | INTRAMUSCULAR | Status: AC
  Administered 2014-12-13: 12.5 mg via INTRAVENOUS
  Filled 2014-12-13: qty 1

## 2014-12-13 NOTE — ED Notes (Signed)
Assumed care and report from Amy Burns,RN at this time.

## 2014-12-13 NOTE — ED Provider Notes (Signed)
CSN: [id number]     Arrival date & time 12/13/14  1246 History   First MD Initiated Contact with Patient 12/13/14 1331     Chief Complaint  Patient presents with  . Chest Pain     (Consider location/radiation/quality/duration/timing/severity/associated sxs/prior Treatment) HPI   Caitlin Williams is a 59 y.o. female, with a history of WPW and GERD, presenting to the ED with left sided chest pain since this morning. Pt states the pain is a pressure, 7/10, radiating straight through to her back. Pt awoke with the pain this morning about 9 am. Pt states she felt like her heart was racing. Pt took her propranolol along with her normal morning medications when she got up and then took her flecainide at about 10 am. The flecainide helped the sensation of her heart racing, but did not help the pain. Pt did not take anything for the pain specifically. Pt also complains of nausea, lightheadedness, and a headache, which she attributes to her increased blood pressure.  Denies vomiting, shortness of breath, leg swelling or pain, recent illness, or any other pain or complaints. Pt states she has felt like this before, but it usually resolves with the flecainide. Pt had surgery on her esophagus in September that ended up giving her a pain complication that made her return to the ED that evening. Pt states she has had pain with eating since her surgery, but the pain she is having today is completely different.  Pt also states she has gone through three pacemakers and "wore them out," but does not have one currently. Pt adds that she thinks this may be muscular in nature because she has been reaching and moving boxes to decorate for Christmas.    Past Medical History  Diagnosis Date  . Migraine   . Kidney stones   . Gastroparesis   . FHx: kidney cancer   . Acid reflux   . WPW (Wolff-Parkinson-White syndrome)   . Pacemaker   . Hypertension   . Asthma   . Cancer [facility])    Past Surgical History  Procedure  Laterality Date  . Other surgical history      open heart surgery - repair hole in heart  . Kidney surgery    . Stomach surgery    . Cholecystectomy    . Pacemaker insertion    . Pacemaker removal    . Abdominal hysterectomy    . Cardiac surgery    . Av node ablation    . Hemorrhoid surgery     No family history on file. Social History  Substance Use Topics  . Smoking status: Never Smoker   . Smokeless tobacco: None  . Alcohol Use: No   OB History    No data available     Review of Systems  Constitutional: Negative for fever, chills, diaphoresis and unexpected weight change.  Respiratory: Negative for cough, chest tightness and shortness of breath.   Cardiovascular: Positive for chest pain. Negative for palpitations and leg swelling.  Gastrointestinal: Negative for nausea, vomiting, abdominal pain, diarrhea and constipation.  Genitourinary: Negative for dysuria and flank pain.  Musculoskeletal: Negative for back pain.  Skin: Negative for color change and pallor.  Neurological: Negative for dizziness, syncope, weakness and light-headedness.  All other systems reviewed and are negative.     Allergies  Compazine; Ketorolac tromethamine; Morphine and related; Nsaids; Oxycodone; Toradol; Tramadol; and Demerol  Home Medications   Prior to Admission medications   Medication Sig Start Date End Date Taking?  Authorizing Provider  acetaminophen (TYLENOL) 500 MG tablet Take 1,500 mg by mouth every 6 (six) hours as needed. Patient used this medication for pain today at 3 pm.    Historical Provider, MD  Albuterol (VENTOLIN IN) Inhale into the lungs.    Historical Provider, MD  Budesonide-Formoterol Fumarate (SYMBICORT IN) Inhale into the lungs.    Historical Provider, MD  dexlansoprazole (DEXILANT) 60 MG capsule Take 60 mg by mouth 2 (two) times daily.      Historical Provider, MD  flecainide (TAMBOCOR) 100 MG tablet Take 100 mg by mouth once. Only as needed    Historical Provider,  MD  gabapentin (NEURONTIN) 300 MG capsule Take 900 mg by mouth 3 (three) times daily.     Historical Provider, MD  HYDROcodone-acetaminophen (NORCO/VICODIN) 5-325 MG per tablet Take 1-2 tablets by mouth every 4 (four) hours as needed for moderate pain or severe pain. 02/23/14   Alfonzo Beers, MD  lidocaine (LIDODERM) 5 % Place 1 patch onto the skin daily. Remove & Discard patch within 12 hours or as directed by MD    Historical Provider, MD  lisinopril (PRINIVIL,ZESTRIL) 20 MG tablet Take 20 mg by mouth 2 (two) times daily.    Historical Provider, MD  losartan (COZAAR) 100 MG tablet Take 100 mg by mouth daily.    Historical Provider, MD  nitroGLYCERIN (NITRODUR - DOSED IN MG/24 HR) 0.4 mg/hr Place 1 patch onto the skin as needed.      Historical Provider, MD  ondansetron (ZOFRAN-ODT) 8 MG disintegrating tablet Take 8 mg by mouth every 8 (eight) hours as needed.      Historical Provider, MD  oxyCODONE-acetaminophen (PERCOCET/ROXICET) 5-325 MG per tablet Take 1-2 tablets by mouth every 6 (six) hours as needed for severe pain. 06/23/13   Blanchie Dessert, MD  pantoprazole (PROTONIX) 40 MG tablet Take 40 mg by mouth 2 (two) times daily.    Historical Provider, MD  penicillin v potassium (VEETID) 500 MG tablet Take 1 tablet (500 mg total) by mouth 3 (three) times daily. 02/23/14   Alfonzo Beers, MD  propranolol (INDERAL) 60 MG tablet Take 60 mg by mouth 2 (two) times daily.    Historical Provider, MD   BP 117/85 mmHg  Pulse 124  Temp(Src) 98.2 F (36.8 C) (Oral)  Resp 18  Ht 5\' 2"  (1.575 m)  Wt 202 lb (91.627 kg)  BMI 36.94 kg/m2  SpO2 96% Physical Exam  Constitutional: She appears well-developed and well-nourished. No distress.  HENT:  Head: Normocephalic and atraumatic.  Eyes: Conjunctivae are normal. Pupils are equal, round, and reactive to light.  Cardiovascular: Regular rhythm and normal heart sounds.  Tachycardia present.   Pulmonary/Chest: Effort normal and breath sounds normal. No  tachypnea. No respiratory distress.  Pain is not reproducible with palpation.  Abdominal: Soft. Bowel sounds are normal.  Musculoskeletal: She exhibits no edema or tenderness.  Neurological: She is alert.  Skin: Skin is warm and dry. She is not diaphoretic.  Nursing note and vitals reviewed.   ED Course  Procedures (including critical care time) Labs Review Labs Reviewed  BASIC METABOLIC PANEL - Abnormal; Notable for the following:    Glucose, Bld 117 (*)    All other components within normal limits  CBC  TROPONIN I  TROPONIN I    Imaging Review Dg Chest 2 View  12/13/2014  CLINICAL DATA:  C/o generalized chest/upper back pain today w/SOB; hx CAD, CABG, prev pacemaker x 3, asthma, AV node ablation, WPW syndrome, renal cancer  EXAM: CHEST  2 VIEW COMPARISON:  10/09/2014 FINDINGS: There are changes from previous cardiac surgery, stable. Cardiac silhouette is normal in size and configuration. Normal mediastinal and hilar contours. Clear lungs.  No pleural effusion or pneumothorax. Bony thorax is demineralized but intact. IMPRESSION: No active cardiopulmonary disease. Electronically Signed   By: Lajean Manes M.D.   On: 12/13/2014 13:39   Ct Angio Chest Pe W/cm &/or Wo Cm  12/13/2014  CLINICAL DATA:  Chest pain and tachycardia. History of cardiac ablation. History of kidney cancer. Evaluate for pulmonary embolism. EXAM: CT ANGIOGRAPHY CHEST WITH CONTRAST TECHNIQUE: Multidetector CT imaging of the chest was performed using the standard protocol during bolus administration of intravenous contrast. Multiplanar CT image reconstructions and MIPs were obtained to evaluate the vascular anatomy. CONTRAST:  112mL OMNIPAQUE IOHEXOL 350 MG/ML SOLN COMPARISON:  Chest CT - 10/20/2014; 10/09/2014 FINDINGS: Vascular Findings: There is adequate opacification of the pulmonary arterial system with the main pulmonary artery measuring 288 Hounsfield units. There are no discrete filling defects within the  pulmonary arterial tree to suggest pulmonary embolism. Normal caliber the main pulmonary artery. Normal heart size. No pericardial effusion. Post median sternotomy. Normal caliber of the thoracic aorta. No evidence of thoracic aortic dissection or periaortic stranding on this nongated examination. Incidental note is made of an azygos nipple. Bovine configuration of the aortic arch. The branch vessels of the aortic arch appear widely patent throughout their imaged course. Review of the MIP images confirms the above findings. ---------------------------------------------------------------------------------- Nonvascular Findings: Minimal dependent subpleural ground-glass atelectasis. No focal airspace opacities. No pleural effusion or pneumothorax. The central pulmonary airways appear widely patent. No discrete pulmonary nodules. No mediastinal, hilar axillary lymphadenopathy. Limited early arterial phase evaluation of the upper abdomen demonstrates diffuse decreased attenuation of the hepatic parenchyma suggestive of hepatic steatosis. No acute or aggressive osseous abnormalities. Regional soft tissues appear normal. Normal appearance of the thyroid gland. IMPRESSION: 1. No acute cardiopulmonary disease. Specifically, no evidence of pulmonary embolism. 2. Suspected hepatic steatosis. Correlation with LFTs is recommended. Electronically Signed   By: Sandi Mariscal M.D.   On: 12/13/2014 15:16   I have personally reviewed and evaluated these images and lab results as part of my medical decision-making.   EKG Interpretation   Date/Time:  Friday December 13 2014 13:01:19 EST Ventricular Rate:  122 PR Interval:  126 QRS Duration: 84 QT Interval:  324 QTC Calculation: 461 R Axis:   63 Text Interpretation:  Sinus tachycardia Non-specific ST-t changes  Confirmed by Wilson Singer  MD, STEPHEN ([id number]) on 12/13/2014 2:21:57 PM      MDM   Final diagnoses:  Chest pain, unspecified chest pain type  Tachycardia     Daron Offer presents with complaint of tachycardia, palpitations, and chest pain for the past 5 hours.  Findings and plan of care discussed with Virgel Manifold, MD.  Patient's exam was benign except for tachycardia. Suspect that the discomfort and the feeling of palpitations could be due to the patient's increased heart rate. EKG shows sinus tachycardia with mild delta waves, indicating WPW. Wells criteria this patient in moderate risk group and HEART score also puts patient in moderate risk group (4 points). 3:33 PM upon reassessment patient states that her pain has improved somewhat and her nausea is resolved. CT shows no evidence of PE or other cardiopulmonary disease. Review of cardiology notes states patient has a history of inappropriate sinus tachycardia. I suspect that this is what may be happening today. Plan to repeat troponin at the  3 hour mark, and if this is also negative, will discharge patient and have her follow up with her cardiologist. Patient's repeat troponin is negative as well. Patient to be discharged.   Lorayne Bender, PA-C 12/13/14 1658  Virgel Manifold, MD 12/15/14 [phone]

## 2014-12-13 NOTE — Discharge Instructions (Signed)
You have been seen today for chest pain. Your imaging and lab tests showed no abnormalities. It is recommended that you follow up with your cardiologist on this matter as soon as possible. Follow up with PCP as needed. Return to ED should symptoms worsen. It is recommended that you take your flecainide as prescribed twice a day until symptoms resolve.   Emergency Department Resource Guide 1) Find a Doctor and Pay Out of Pocket Although you won't have to find out who is covered by your insurance plan, it is a good idea to ask around and get recommendations. You will then need to call the office and see if the doctor you have chosen will accept you as a new patient and what types of options they offer for patients who are self-pay. Some doctors offer discounts or will set up payment plans for their patients who do not have insurance, but you will need to ask so you aren't surprised when you get to your appointment.  2) Contact Your Local Health Department Not all health departments have doctors that can see patients for sick visits, but many do, so it is worth a call to see if yours does. If you don't know where your local health department is, you can check in your phone book. The CDC also has a tool to help you locate your state's health department, and many state websites also have listings of all of their local health departments.  3) Find a [facility] If your illness is not likely to be very severe or complicated, you may want to try a walk in clinic. These are popping up all over the country in pharmacies, drugstores, and shopping centers. They're usually staffed by nurse practitioners or physician assistants that have been trained to treat common illnesses and complaints. They're usually fairly quick and inexpensive. However, if you have serious medical issues or chronic medical problems, these are probably not your best option.  No Primary Care Doctor: - Call Health Connect at  [phone] -  they can help you locate a primary care doctor that  accepts your insurance, provides certain services, etc. - Physician Referral Service- [phone]  Chronic Pain Problems: Organization         Address  Phone   Notes  [facility]  [phone] Patients need to be referred by their primary care doctor.   Medication Assistance: Organization         Address  Phone   Notes  [facility] Medication [facility] [address] [place], [place] [address] [phone] --Must be a resident of [facility] -- Must have NO insurance coverage whatsoever (no Medicaid/ Medicare, etc.) -- The pt. MUST have a primary care doctor that directs their care regularly and follows them in the community   MedAssist  [phone]   [employer]  [phone]    Agencies that provide inexpensive medical care: Organization         Address  Phone   Notes  [place]  [phone]   [facility]    [phone]   [facility] [place], [place] [address] [phone]   [place] [address] [place] [phone]   Planned Parenthood    [phone]   [facility]    [phone]   [place] and [place], [employer] Phone:  [phone], Fax:  [phone] Hours of Operation:  9 am - 6 pm, M-F.  Also accepts Medicaid/Medicare and self-pay.  [facility] for [address], [address], [place] Phone: [phone], Fax: [phone]. Hours of Operation:  8:30 am - 5:30 pm, M-F.  Also accepts Medicaid and self-pay.  [facility] [place] [address] [place] Phone: [phone]   [place], [place], [place] [phone], Ext. [phone] Mondays & Thursdays: 7-9 AM.  First 15 patients are seen on a first come, first serve basis.    [place] Providers:  Organization         Address  Phone   Notes  [facility] [address], [place] [phone] Also accepts self-pay patients.  [facility] [place] [address], [place]  [phone]   [place], [address], [place] [phone]   [facility] [address] [place] [phone]   Lucianne Lei [address], [place]   [phone] Only accepts [place] Access [place] patients after they have their name applied to their card.   Self-Pay (no insurance) in [facility]:  Organization         Address  Phone   Notes  Sickle Cell Patients, [facility] [place] [phone]   [facility] [place] [phone]   [facility] [place]  [place], [place], [place] [phone]   [facility]/Dr. Osei-Bonsu  [address], [place] or [address], [place] [phone] Phone number for both [place] and [place] locations is the same.  Urgent [facility] and [facility] [address], [place] [phone]   [facility] [address], [place] or [address] [phone]   [facility] [address] [place] [phone], phone; [phone], fax Sees patients 1st and 3rd Saturday of every month.  Must not qualify for public or private insurance (i.e. Medicaid, Medicare, [place] Health Choice, Veterans' Benefits)  Household income should be no more than 200% of the poverty level The clinic cannot treat you if you are pregnant or think you are pregnant  Sexually transmitted diseases are not treated at the clinic.    Dental Care: Organization         Address  Phone  Notes  [facility] Department of [facility] [place] [phone] Accepts children up to age 7 who are enrolled in [place] or [place]; pregnant women with a Medicaid card; and children who have applied for Medicaid or [place] Health Choice, but were declined, whose parents can pay a reduced fee at time of service.  [facility] Department of [facility]  [address], [place] [phone] Accepts children up to age 22 who are enrolled in [place] or [place]; pregnant women with a Medicaid card; and children who have applied for Medicaid or [place] Health Choice, but were declined, whose parents can pay a reduced fee at time of service.  [place] Adult Dental Access PROGRAM  [place] [phone] Patients are seen by appointment only. Walk-ins  are not accepted. [place] will see patients 57 years of age and older. Monday - Tuesday (8am-5pm) Most Wednesdays (8:30-5pm) $30 per visit, cash only  [facility] Adult Dental Access PROGRAM  [address], [facility] [phone] Patients are seen by appointment only. Walk-ins are not accepted. [place] will see patients 68 years of age and older. One Wednesday Evening (Monthly: Volunteer Based).  $30 per visit, cash only  [place]  [phone] for adults; Children under age 80, call Graduate Pediatric Dentistry at [phone]. Children aged 24-14, please call [phone] to request a pediatric application.  Dental services are provided in all areas of dental care including fillings, crowns and bridges, complete and partial dentures, implants, gum treatment, root canals, and extractions. Preventive care is also provided. Treatment is provided to both adults and children. Patients are selected via a lottery and there is often a waiting list.   [facility] [address] [place]  [phone] [email or web address]   [facility] [address] [place], [place] [phone], Ext. [phone] Second and Fourth Thursday of each month, opens at 6:30 AM; Clinic ends at 9 AM.  Patients are seen on a first-come first-served basis, and a limited number are seen during each clinic.   [facility]  [address] Hillard Danker [place], [place] [phone]   Eligibility Requirements You must have lived in [place], [place], or [place] counties for at least the last three months.   You cannot be eligible for state or federal sponsored [employer], including [employer], [place], or [employer].   You generally cannot be eligible for healthcare insurance through your employer.    How to apply: Eligibility screenings are held every Tuesday and Wednesday afternoon from 1:00 pm until 4:00 pm. You do not need an appointment for the interview!  [facility] [address], [place], [place]   [place]  [place] Department  [place]  [phone]    Behavioral Health Resources in the Community: Intensive Outpatient Programs Organization         Address  Phone  Notes  [place]. [address], [place], [place] [phone]   [facility] Outpatient [address], [place], [place]   ADS: Alcohol & Drug Svcs [address] [place], [place]   [place] [address],  [place], [place] or [phone]   Substance Abuse Resources Organization         Address  Phone  Notes  Alcohol and Drug Services  [phone]   [place]  [phone]   [facility]   Chinita Pester  [phone]   Residential & Outpatient Substance Abuse Program  [phone]   Psychological Services Organization         Address  Phone  Notes  [facility] [place]   [place]  [phone]   [place] [address], [place] [phone] or [phone]    Mobile Crisis Teams Organization         Address  Phone  Notes  [facility]atives, Mobile Crisis Care Unit  [phone]   Assertive Psychotherapeutic Services  [address] [place], [place]   [facility] [address] [place] [phone]  Self-Help/Support Groups Organization         Address  Phone             Notes  Mental Health Assoc. of [place] - variety of support groups  [place] Call for more information  Narcotics Anonymous (NA), [facility] [address], [employer] [place]  2 meetings at this location   Special educational needs teacher         Address  Phone  Notes  [facility] Residential Treatment [place],    [place]  [phone]   [facility]  [address], [place] [address], [place], [place]   [place], [place] [phone] Admissions: 8am-3pm M-F  Incentives Substance [place] [address],    [employer], [place] [phone]   [facility] [address] [place], [place], [place]   [facility] [address]  [place], [place]   [facility]ight Programs - Intensive Outpatient [address], [place], [place]   [facility] ([place] [place].,  [place], [place] [phone] or [phone]   [facility]idential Treatment Services (RTS) [address], [place], [place] Accepts Medicaid  [facility] [place] [address],  [place] [phone] Substance Abuse/Addiction Treatment   [facility] Organization         Address  Phone  Notes  [facility]  [phone]   Domenic Schwab, PhD [address] [place], [place]   [phone] or  [phone]   [place], [place] [phone]   [facility] [address], [place], [place] [phone] Insurance/Medicaid/sponsorship through [facility] and Families [address] [place]                                    [place], [place] [phone] [place] [address], [place] [phone]    Dr. Adele Schilder  [phone]   [facility] Dept. 1) [address], [place] 2) [place] 3)  [place] [address], [place] [phone]  [phone]   [place] [phone] or [phone] (After Hours)

## 2014-12-13 NOTE — ED Notes (Signed)
Chest pain x 30 minutes. States it feels like her heart is skipping.

## 2014-12-13 NOTE — ED Notes (Signed)
Pt to room 3 from xray, md at bedside.

## 2014-12-13 NOTE — ED Provider Notes (Signed)
59 year old female with left-sided chest pain with radiation to her back. Constant since she woke up. EKG shows sinus tachycardia. Subsequent CT without evidence of PE or dissection. Initial troponin is normal. Per review of cardiology notes through "Care Everywhere," she has a history of inappropriate sinus tachycardia. I could not locate a cath reports, but mention of "normal cath last year" in telephone encounter dated a few days ago. Seems atypical for ACS. Will obtain repeat troponin. Anticipate discharge if this remains normal.  Medical screening examination/treatment/procedure(s) were conducted as a shared visit with non-physician practitioner(s) and myself.  I personally evaluated the patient during the encounter.   EKG Interpretation   Date/Time:  Friday December 13 2014 13:01:19 EST Ventricular Rate:  122 PR Interval:  126 QRS Duration: 84 QT Interval:  324 QTC Calculation: 461 R Axis:   63 Text Interpretation:  Sinus tachycardia Non-specific ST-t changes  Confirmed by Wilson Singer  MD, Hanni Milford ([id number]) on 12/13/2014 2:21:57 PM       Virgel Manifold, MD 12/13/14 [phone]

## 2015-03-12 ENCOUNTER — Emergency Department (HOSPITAL_BASED_OUTPATIENT_CLINIC_OR_DEPARTMENT_OTHER)
Admission: EM | Admit: 2015-03-12 | Discharge: 2015-03-12 | Disposition: A | Discharge status: Home / Self Care | Payer: Medicaid Other | Attending: Emergency Medicine | Admitting: Emergency Medicine

## 2015-03-12 ENCOUNTER — Encounter (HOSPITAL_BASED_OUTPATIENT_CLINIC_OR_DEPARTMENT_OTHER): Payer: Self-pay | Admitting: Emergency Medicine

## 2015-03-12 ENCOUNTER — Emergency Department (HOSPITAL_BASED_OUTPATIENT_CLINIC_OR_DEPARTMENT_OTHER): Payer: Medicaid Other

## 2015-03-12 DIAGNOSIS — Z7951 Long term (current) use of inhaled steroids: Secondary | ICD-10-CM | POA: Diagnosis not present

## 2015-03-12 DIAGNOSIS — M549 Dorsalgia, unspecified: Secondary | ICD-10-CM | POA: Diagnosis not present

## 2015-03-12 DIAGNOSIS — R0789 Other chest pain: Secondary | ICD-10-CM

## 2015-03-12 DIAGNOSIS — Z87442 Personal history of urinary calculi: Secondary | ICD-10-CM | POA: Insufficient documentation

## 2015-03-12 DIAGNOSIS — Z859 Personal history of malignant neoplasm, unspecified: Secondary | ICD-10-CM | POA: Diagnosis not present

## 2015-03-12 DIAGNOSIS — I456 Pre-excitation syndrome: Secondary | ICD-10-CM | POA: Diagnosis not present

## 2015-03-12 DIAGNOSIS — K219 Gastro-esophageal reflux disease without esophagitis: Secondary | ICD-10-CM | POA: Diagnosis not present

## 2015-03-12 DIAGNOSIS — R05 Cough: Secondary | ICD-10-CM | POA: Diagnosis not present

## 2015-03-12 DIAGNOSIS — R51 Headache: Secondary | ICD-10-CM | POA: Diagnosis not present

## 2015-03-12 DIAGNOSIS — Z950 Presence of cardiac pacemaker: Secondary | ICD-10-CM | POA: Diagnosis not present

## 2015-03-12 DIAGNOSIS — Z792 Long term (current) use of antibiotics: Secondary | ICD-10-CM | POA: Insufficient documentation

## 2015-03-12 DIAGNOSIS — F419 Anxiety disorder, unspecified: Secondary | ICD-10-CM | POA: Diagnosis not present

## 2015-03-12 DIAGNOSIS — I10 Essential (primary) hypertension: Secondary | ICD-10-CM | POA: Diagnosis not present

## 2015-03-12 DIAGNOSIS — J45901 Unspecified asthma with (acute) exacerbation: Secondary | ICD-10-CM | POA: Diagnosis not present

## 2015-03-12 DIAGNOSIS — Z79899 Other long term (current) drug therapy: Secondary | ICD-10-CM | POA: Insufficient documentation

## 2015-03-12 DIAGNOSIS — Z9889 Other specified postprocedural states: Secondary | ICD-10-CM | POA: Insufficient documentation

## 2015-03-12 DIAGNOSIS — R079 Chest pain, unspecified: Secondary | ICD-10-CM | POA: Diagnosis present

## 2015-03-12 LAB — CBC
HEMATOCRIT: 45.3 % (ref 36.0–46.0)
HEMOGLOBIN: 14.6 g/dL (ref 12.0–15.0)
MCH: 29.6 pg (ref 26.0–34.0)
MCHC: 32.2 g/dL (ref 30.0–36.0)
MCV: 91.9 fL (ref 78.0–100.0)
Platelets: 271 10*3/uL (ref 150–400)
RBC: 4.93 MIL/uL (ref 3.87–5.11)
RDW: 13.1 % (ref 11.5–15.5)
WBC: 11.2 10*3/uL — ABNORMAL HIGH (ref 4.0–10.5)

## 2015-03-12 LAB — TROPONIN I
Troponin I: <0.03 ng/mL (ref <0.031)
Troponin I: <0.03 ng/mL (ref <0.031)

## 2015-03-12 LAB — COMPREHENSIVE METABOLIC PANEL
ALBUMIN: 4.6 g/dL (ref 3.5–5.0)
ALK PHOS: 72 U/L (ref 38–126)
ALT: 32 U/L (ref 14–54)
ANION GAP: 11 (ref 5–15)
AST: 28 U/L (ref 15–41)
BILIRUBIN TOTAL: 0.7 mg/dL (ref 0.3–1.2)
BUN: 20 mg/dL (ref 6–20)
CALCIUM: 9.7 mg/dL (ref 8.9–10.3)
CO2: 28 mmol/L (ref 22–32)
CREATININE: 0.97 mg/dL (ref 0.44–1.00)
Chloride: 103 mmol/L (ref 101–111)
GFR calc Af Amer: >60 mL/min (ref >60)
GFR calc non Af Amer: >60 mL/min (ref >60)
GLUCOSE: 131 mg/dL — AB (ref 65–99)
Potassium: 4 mmol/L (ref 3.5–5.1)
Sodium: 142 mmol/L (ref 135–145)
TOTAL PROTEIN: 7.9 g/dL (ref 6.5–8.1)

## 2015-03-12 LAB — D-DIMER, QUANTITATIVE: D-Dimer, Quant: 0.31 ug/mL-FEU (ref 0.00–0.50)

## 2015-03-12 MED ORDER — LORAZEPAM 2 MG/ML IJ SOLN
0.5000 mg | Freq: Once | INTRAMUSCULAR | Status: AC
  Administered 2015-03-12: 0.5 mg via INTRAVENOUS
  Filled 2015-03-12: qty 1

## 2015-03-12 MED ORDER — SODIUM CHLORIDE 0.9 % IV BOLUS (SEPSIS)
500.0000 mL | Freq: Once | INTRAVENOUS | Status: AC
  Administered 2015-03-12: 16:00:00 via INTRAVENOUS

## 2015-03-12 MED ORDER — SODIUM CHLORIDE 0.9 % IV BOLUS (SEPSIS)
500.0000 mL | Freq: Once | INTRAVENOUS | Status: AC
  Administered 2015-03-12: 500 mL via INTRAVENOUS

## 2015-03-12 MED ORDER — HYDROCODONE-ACETAMINOPHEN 5-325 MG PO TABS
1.0000 | ORAL_TABLET | Freq: Once | ORAL | Status: AC
Start: 2015-03-12 — End: 2015-03-12
  Administered 2015-03-12: 1 via ORAL
  Filled 2015-03-12: qty 1

## 2015-03-12 MED ORDER — ONDANSETRON HCL 4 MG/2ML IJ SOLN
4.0000 mg | Freq: Once | INTRAMUSCULAR | Status: AC
  Administered 2015-03-12: 4 mg via INTRAVENOUS
  Filled 2015-03-12: qty 2

## 2015-03-12 MED ORDER — ALPRAZOLAM 0.25 MG PO TABS: 0.2500 mg | ORAL_TABLET | Freq: Two times a day (BID) | ORAL | Status: DC | PRN

## 2015-03-12 NOTE — Discharge Instructions (Signed)

## 2015-03-12 NOTE — ED Provider Notes (Signed)
CSN: [id number]     Arrival date & time 03/12/15  94 History   First MD Initiated Contact with Patient 03/12/15 1521     Chief Complaint  Patient presents with  . Chest Pain     (Consider location/radiation/quality/duration/timing/severity/associated sxs/prior Treatment) HPI Patient presents with acute onset left-sided chest pain starting roughly 2 hours prior to presentation. Patient states she was resting when symptoms began. Associated with mild shortness of breath. She has ongoing nonproductive cough which is unchanged. She states she does have some nausea associated with the chest pain. Describes the pain as sharp. She has pain also in the left thoracic back. She did yardwork several days ago but denies any known trauma. No new lower extremity swelling or asymmetry. No fever or chills. No recent extended travel. No family or personal history of PE or CAD. Past Medical History  Diagnosis Date  . Migraine   . Kidney stones   . Gastroparesis   . FHx: kidney cancer   . Acid reflux   . WPW (Wolff-Parkinson-White syndrome)   . Pacemaker   . Hypertension   . Asthma   . Cancer [facility])    Past Surgical History  Procedure Laterality Date  . Other surgical history      open heart surgery - repair hole in heart  . Kidney surgery    . Stomach surgery    . Cholecystectomy    . Pacemaker insertion      took out 1984  . Pacemaker removal    . Abdominal hysterectomy    . Cardiac surgery    . Av node ablation    . Hemorrhoid surgery     History reviewed. No pertinent family history. Social History  Substance Use Topics  . Smoking status: Never Smoker   . Smokeless tobacco: None  . Alcohol Use: No   OB History    No data available     Review of Systems  Constitutional: Negative for fever and chills.  Respiratory: Positive for cough and shortness of breath.   Cardiovascular: Positive for chest pain and palpitations. Negative for leg swelling.  Gastrointestinal: Positive for  nausea. Negative for vomiting, abdominal pain and diarrhea.  Musculoskeletal: Positive for myalgias and back pain. Negative for neck pain and neck stiffness.  Skin: Negative for rash and wound.  Neurological: Positive for headaches. Negative for dizziness, weakness, light-headedness and numbness.  Psychiatric/Behavioral: The patient is nervous/anxious.   All other systems reviewed and are negative.     Allergies  Compazine; Ketorolac tromethamine; Morphine and related; Nsaids; Oxycodone; Toradol; Tramadol; and Demerol  Home Medications   Prior to Admission medications   Medication Sig Start Date End Date Taking? Authorizing Provider  acetaminophen (TYLENOL) 500 MG tablet Take 1,500 mg by mouth every 6 (six) hours as needed. Patient used this medication for pain today at 3 pm.    Historical Provider, MD  Albuterol (VENTOLIN IN) Inhale into the lungs.    Historical Provider, MD  ALPRAZolam Duanne Moron) 0.25 MG tablet Take 1 tablet (0.25 mg total) by mouth 2 (two) times daily as needed for anxiety. 03/12/15   Julianne Rice, MD  Budesonide-Formoterol Fumarate (SYMBICORT IN) Inhale into the lungs.    Historical Provider, MD  dexlansoprazole (DEXILANT) 60 MG capsule Take 60 mg by mouth 2 (two) times daily.      Historical Provider, MD  flecainide (TAMBOCOR) 100 MG tablet Take 100 mg by mouth once. Only as needed    Historical Provider, MD  gabapentin (NEURONTIN) 300  MG capsule Take 900 mg by mouth 3 (three) times daily.     Historical Provider, MD  HYDROcodone-acetaminophen (NORCO/VICODIN) 5-325 MG per tablet Take 1-2 tablets by mouth every 4 (four) hours as needed for moderate pain or severe pain. 02/23/14   Alfonzo Beers, MD  lidocaine (LIDODERM) 5 % Place 1 patch onto the skin daily. Remove & Discard patch within 12 hours or as directed by MD    Historical Provider, MD  lisinopril (PRINIVIL,ZESTRIL) 20 MG tablet Take 20 mg by mouth 2 (two) times daily.    Historical Provider, MD  losartan (COZAAR)  100 MG tablet Take 100 mg by mouth daily.    Historical Provider, MD  nitroGLYCERIN (NITRODUR - DOSED IN MG/24 HR) 0.4 mg/hr Place 1 patch onto the skin as needed.      Historical Provider, MD  ondansetron (ZOFRAN-ODT) 8 MG disintegrating tablet Take 8 mg by mouth every 8 (eight) hours as needed.      Historical Provider, MD  oxyCODONE-acetaminophen (PERCOCET/ROXICET) 5-325 MG per tablet Take 1-2 tablets by mouth every 6 (six) hours as needed for severe pain. 06/23/13   Blanchie Dessert, MD  pantoprazole (PROTONIX) 40 MG tablet Take 40 mg by mouth 2 (two) times daily.    Historical Provider, MD  penicillin v potassium (VEETID) 500 MG tablet Take 1 tablet (500 mg total) by mouth 3 (three) times daily. 02/23/14   Alfonzo Beers, MD  propranolol (INDERAL) 60 MG tablet Take 60 mg by mouth 2 (two) times daily.    Historical Provider, MD   BP 129/77 mmHg  Pulse 109  Temp(Src) 97.6 F (36.4 C) (Oral)  Resp 18  Ht 5\' 2"  (1.575 m)  Wt 202 lb (91.627 kg)  BMI 36.94 kg/m2  SpO2 99% Physical Exam  Constitutional: She is oriented to person, place, and time. She appears well-developed and well-nourished. No distress.  Patient is very anxious appearing  HENT:  Head: Normocephalic and atraumatic.  Mouth/Throat: Oropharynx is clear and moist. No oropharyngeal exudate.  Eyes: EOM are normal. Pupils are equal, round, and reactive to light.  Neck: Normal range of motion. Neck supple.  Cardiovascular: Regular rhythm.  Exam reveals no gallop.   No murmur heard. Tachycardia  Pulmonary/Chest: Effort normal and breath sounds normal. No respiratory distress. She has no wheezes. She has no rales. She exhibits tenderness (Tenderness to the left upper chest with palpation. There is no crepitance or deformity.).  Abdominal: Soft. Bowel sounds are normal. She exhibits no distension and no mass. There is no tenderness. There is no rebound and no guarding.  Musculoskeletal: Normal range of motion. She exhibits tenderness.  She exhibits no edema.  Patient has tenderness to palpation over the medial border of the left scapula. There is no midline thoracic or lumbar tenderness. No CVA tenderness bilaterally. No lower extremity swelling or pain. Distal pulses equal and intact.  Neurological: She is alert and oriented to person, place, and time.  Patient is alert and oriented x3 with clear, goal oriented speech. Patient has 5/5 motor in all extremities. Sensation is intact to light touch.  Skin: Skin is warm and dry. No rash noted. No erythema.  Nursing note and vitals reviewed.   ED Course  Procedures (including critical care time) Labs Review Labs Reviewed  CBC - Abnormal; Notable for the following:    WBC 11.2 (*)    All other components within normal limits  COMPREHENSIVE METABOLIC PANEL - Abnormal; Notable for the following:    Glucose, Bld 131 (*)  All other components within normal limits  TROPONIN I  D-DIMER, QUANTITATIVE (NOT AT [facility])  TROPONIN I    Imaging Review Dg Chest 2 View  03/12/2015  CLINICAL DATA:  Mid chest pain with shortness of breath and diaphoresis today. EXAM: CHEST  2 VIEW COMPARISON:  Radiographs and CT 12/13/2014. FINDINGS: The heart size and mediastinal contours are stable status post median sternotomy. The lungs are clear. There is no pleural effusion or pneumothorax. The bones appear unchanged. IMPRESSION: Stable postoperative chest.  No acute cardiopulmonary process. Electronically Signed   By: Richardean Sale M.D.   On: 03/12/2015 15:13   I have personally reviewed and evaluated these images and lab results as part of my medical decision-making.   EKG Interpretation   Date/Time:  Wednesday March 12 2015 14:44:15 EST Ventricular Rate:  111 PR Interval:  122 QRS Duration: 84 QT Interval:  340 QTC Calculation: 462 R Axis:   69 Text Interpretation:  Sinus tachycardia Nonspecific T wave abnormality  Abnormal ECG No acute changes Confirmed by Kathrynn Humble, MD, Thelma Comp  [phone]) on  03/12/2015 2:50:17 PM      MDM   Final diagnoses:  Left-sided chest wall pain  Anxiety    Patient denies any recent changes in her medication or missing any doses. Patient's chest pain is reproduced with palpation. She has a EKG without evidence of ischemia. Troponin 2 is normal. She has a normal d-dimer. Heart rate and blood pressure improved with anxiolytic. I think that anxiety is contributing to the patient's symptoms and vital signs. I referred her back to her primary physician and cardiologist. Patient and her husband are anxious to leave. She does not want to be admitted. Return precautions given.    Julianne Rice, MD 03/12/15 [phone]

## 2015-03-12 NOTE — ED Notes (Signed)
Patient states that she started to have chest pain and pressure to the left side of her chest this morning. The patient reports that she is nauseated and vomiting earlier today. Patient also noted to have High blood pressure and Tachypnea

## 2015-03-12 NOTE — ED Notes (Signed)
Patient transported to X-ray 

## 2015-12-03 ENCOUNTER — Encounter (HOSPITAL_BASED_OUTPATIENT_CLINIC_OR_DEPARTMENT_OTHER): Payer: Self-pay

## 2015-12-03 ENCOUNTER — Emergency Department (HOSPITAL_BASED_OUTPATIENT_CLINIC_OR_DEPARTMENT_OTHER): Payer: Medicaid Other

## 2015-12-03 ENCOUNTER — Emergency Department (HOSPITAL_BASED_OUTPATIENT_CLINIC_OR_DEPARTMENT_OTHER)
Admission: EM | Admit: 2015-12-03 | Discharge: 2015-12-03 | Disposition: A | Discharge status: Home / Self Care | Payer: Medicaid Other | Attending: Emergency Medicine | Admitting: Emergency Medicine

## 2015-12-03 DIAGNOSIS — Y999 Unspecified external cause status: Secondary | ICD-10-CM | POA: Diagnosis not present

## 2015-12-03 DIAGNOSIS — Y92002 Bathroom of unspecified non-institutional (private) residence single-family (private) house as the place of occurrence of the external cause: Secondary | ICD-10-CM | POA: Insufficient documentation

## 2015-12-03 DIAGNOSIS — I10 Essential (primary) hypertension: Secondary | ICD-10-CM | POA: Diagnosis not present

## 2015-12-03 DIAGNOSIS — Z85528 Personal history of other malignant neoplasm of kidney: Secondary | ICD-10-CM | POA: Diagnosis not present

## 2015-12-03 DIAGNOSIS — J45909 Unspecified asthma, uncomplicated: Secondary | ICD-10-CM | POA: Diagnosis not present

## 2015-12-03 DIAGNOSIS — W010XXA Fall on same level from slipping, tripping and stumbling without subsequent striking against object, initial encounter: Secondary | ICD-10-CM | POA: Diagnosis not present

## 2015-12-03 DIAGNOSIS — Z79899 Other long term (current) drug therapy: Secondary | ICD-10-CM | POA: Diagnosis not present

## 2015-12-03 DIAGNOSIS — S4992XA Unspecified injury of left shoulder and upper arm, initial encounter: Secondary | ICD-10-CM | POA: Insufficient documentation

## 2015-12-03 DIAGNOSIS — Y9389 Activity, other specified: Secondary | ICD-10-CM | POA: Diagnosis not present

## 2015-12-03 DIAGNOSIS — Z791 Long term (current) use of non-steroidal anti-inflammatories (NSAID): Secondary | ICD-10-CM | POA: Insufficient documentation

## 2015-12-03 MED ORDER — IBUPROFEN 600 MG PO TABS: 600.0000 mg | ORAL_TABLET | Freq: Four times a day (QID) | ORAL | 0 refills | Status: DC | PRN

## 2015-12-03 MED ORDER — CYCLOBENZAPRINE HCL 10 MG PO TABS: 10.0000 mg | ORAL_TABLET | Freq: Two times a day (BID) | ORAL | 0 refills | Status: DC | PRN

## 2015-12-03 MED ORDER — TRAMADOL HCL 50 MG PO TABS: 50.0000 mg | ORAL_TABLET | Freq: Four times a day (QID) | ORAL | 0 refills | Status: DC | PRN

## 2015-12-03 NOTE — ED Triage Notes (Signed)
Slipped/fell last night-pain to left shoulder-NAD-steady gait

## 2015-12-03 NOTE — ED Notes (Signed)
Pt still hypertensive but states her pvt MD is working on it.

## 2015-12-03 NOTE — ED Provider Notes (Signed)
[place] [facility] Provider Note   CSN: [id number] Arrival date & time: 12/03/15  1142     History   Chief Complaint Chief Complaint  Patient presents with  . Fall    HPI Caitlin Williams is a 60 y.o. female.  HPI   60 year old female with history of kidney cancer hypertension migraine Wolff-Parkinson-White syndrome with pacemaker presenting for evaluation of a recent fall. Patient reports yesterday afternoon she was standing on top of her bathtub to hang a shower curtain when her foot slipped, patient fell forward breaking her fall with her left arm. She developed acute onset of sharp shooting pain to her left shoulder that has became progressively worse. Pain is moderate in intensity, radiates to her shoulder blade and down her arm as well as up her left side of neck. She tries taking ibuprofen 800 mg every 4 hours as well as using ice with improvement. She denies striking her head or loss of consciousness. She denies any associated numbness or chest pain. She denies any precipitating symptoms prior to fall. She is right-hand dominant. She has had a SLAP procedure on the left shoulder by Dr. Len Childs in the past.  Past Medical History:  Diagnosis Date  . Acid reflux   . Asthma   . Cancer ([place])   . FHx: kidney cancer   . Gastroparesis   . Hypertension   . Kidney stones   . Migraine   . Pacemaker   . WPW (Wolff-Parkinson-White syndrome)     There are no active problems to display for this patient.   Past Surgical History:  Procedure Laterality Date  . ABDOMINAL HYSTERECTOMY    . AV NODE ABLATION    . CARDIAC SURGERY    . CHOLECYSTECTOMY    . HEMORRHOID SURGERY    . KIDNEY SURGERY    . OTHER SURGICAL HISTORY     open heart surgery - repair hole in heart  . PACEMAKER INSERTION     took out 1984  . PACEMAKER REMOVAL    . STOMACH SURGERY      OB History    No data available       Home Medications    Prior to Admission medications   Medication Sig Start Date  End Date Taking? Authorizing Provider  ibuprofen (ADVIL,MOTRIN) 200 MG tablet Take 200 mg by mouth every 6 (six) hours as needed.   Yes Historical Provider, MD  UNKNOWN TO PATIENT "another heart medicine"   Yes Historical Provider, MD  acetaminophen (TYLENOL) 500 MG tablet Take 1,500 mg by mouth every 6 (six) hours as needed. Patient used this medication for pain today at 3 pm.    Historical Provider, MD  Albuterol (VENTOLIN IN) Inhale into the lungs.    Historical Provider, MD  ALPRAZolam Duanne Moron) 0.25 MG tablet Take 1 tablet (0.25 mg total) by mouth 2 (two) times daily as needed for anxiety. 03/12/15   Julianne Rice, MD  Budesonide-Formoterol Fumarate (SYMBICORT IN) Inhale into the lungs.    Historical Provider, MD  flecainide (TAMBOCOR) 100 MG tablet Take 100 mg by mouth once. Only as needed    Historical Provider, MD  gabapentin (NEURONTIN) 300 MG capsule Take 900 mg by mouth 3 (three) times daily.     Historical Provider, MD  lisinopril (PRINIVIL,ZESTRIL) 20 MG tablet Take 20 mg by mouth 2 (two) times daily.    Historical Provider, MD  nitroGLYCERIN (NITRODUR - DOSED IN MG/24 HR) 0.4 mg/hr Place 1 patch onto the skin as needed.  Historical Provider, MD  ondansetron (ZOFRAN-ODT) 8 MG disintegrating tablet Take 8 mg by mouth every 8 (eight) hours as needed.      Historical Provider, MD    Family History No family history on file.  Social History Social History  Substance Use Topics  . Smoking status: Never Smoker  . Smokeless tobacco: Never Used  . Alcohol use No     Allergies   Compazine; Ketorolac tromethamine; Morphine and related; Nsaids; Oxycodone; Toradol [ketorolac tromethamine]; Tramadol; and Demerol   Review of Systems Review of Systems  Constitutional: Negative for fever.  Musculoskeletal: Positive for arthralgias.  Neurological: Negative for numbness and headaches.     Physical Exam Updated Vital Signs BP (!) 163/101 (BP Location: Right Arm)   Pulse 115    Temp 98.7 F (37.1 C) (Oral)   Resp 20   Ht 5\' 2"  (1.575 m)   Wt 95.3 kg   SpO2 98%   BMI 38.41 kg/m   Physical Exam  Constitutional: She appears well-developed and well-nourished. No distress.  HENT:  Head: Atraumatic.  Eyes: Conjunctivae are normal.  Neck: Neck supple.  Musculoskeletal: She exhibits tenderness (Left shoulder: Tenderness noted to [facility] joint, posterior scapular region, and the lateral deltoid on palpation. decreased ROM 2/2 pain, no deformity or overlying skin changes).  No syncopal midline spine tenderness crepitus or step-off.  Neurological: She is alert.  Skin: No rash noted.  Psychiatric: She has a normal mood and affect.  Nursing note and vitals reviewed.    ED Treatments / Results  Labs (all labs ordered are listed, but only abnormal results are displayed) Labs Reviewed - No data to display  EKG  EKG Interpretation None       Radiology Dg Shoulder Left  Result Date: 12/03/2015 CLINICAL DATA:  Fall. EXAM: LEFT SHOULDER - 2+ VIEW COMPARISON:  06/23/2013. FINDINGS: Acromioclavicular glenohumeral degenerative change. No evidence of fracture dislocation. Prior median sternotomy. IMPRESSION: Degenerative changes left shoulder. No acute bony abnormality identified. Electronically Signed   By: Marcello Moores  Register   On: 12/03/2015 12:10    Procedures Procedures (including critical care time)  Medications Ordered in ED Medications - No data to display   Initial Impression / Assessment and Plan / ED Course  I have reviewed the triage vital signs and the nursing notes.  Pertinent labs & imaging results that were available during my care of the patient were reviewed by me and considered in my medical decision making (see chart for details).  Clinical Course    BP (!) 163/101 (BP Location: Right Arm)   Pulse 115   Temp 98.7 F (37.1 C) (Oral)   Resp 20   Ht 5\' 2"  (1.575 m)   Wt 95.3 kg   SpO2 98%   BMI 38.41 kg/m    Final Clinical Impressions(s)  / ED Diagnoses   Final diagnoses:  Injury of left shoulder and upper arm, initial encounter    New Prescriptions New Prescriptions   No medications on file   12:38 PM Pt had a mechanical fall yesterday injuring her L shoulder.  Xray neg for acute fx/dislocation.  RICE therapy discussed.  Shoulder sling to use as needed.  Ortho referral given.   [name], PA-C 12/03/15 [place], MD 12/03/15 [phone]

## 2016-05-22 ENCOUNTER — Emergency Department (HOSPITAL_BASED_OUTPATIENT_CLINIC_OR_DEPARTMENT_OTHER): Payer: Medicaid Other

## 2016-05-22 ENCOUNTER — Emergency Department (HOSPITAL_BASED_OUTPATIENT_CLINIC_OR_DEPARTMENT_OTHER)
Admission: EM | Admit: 2016-05-22 | Discharge: 2016-05-22 | Disposition: A | Discharge status: Home / Self Care | Payer: Medicaid Other | Attending: Emergency Medicine | Admitting: Emergency Medicine

## 2016-05-22 ENCOUNTER — Encounter (HOSPITAL_BASED_OUTPATIENT_CLINIC_OR_DEPARTMENT_OTHER): Payer: Self-pay | Admitting: Emergency Medicine

## 2016-05-22 DIAGNOSIS — J45909 Unspecified asthma, uncomplicated: Secondary | ICD-10-CM | POA: Diagnosis not present

## 2016-05-22 DIAGNOSIS — R0789 Other chest pain: Secondary | ICD-10-CM

## 2016-05-22 DIAGNOSIS — I10 Essential (primary) hypertension: Secondary | ICD-10-CM | POA: Insufficient documentation

## 2016-05-22 DIAGNOSIS — K2900 Acute gastritis without bleeding: Secondary | ICD-10-CM | POA: Diagnosis not present

## 2016-05-22 DIAGNOSIS — Z79899 Other long term (current) drug therapy: Secondary | ICD-10-CM | POA: Insufficient documentation

## 2016-05-22 DIAGNOSIS — R079 Chest pain, unspecified: Secondary | ICD-10-CM

## 2016-05-22 LAB — CBC
HCT: 44.1 % (ref 36.0–46.0)
Hemoglobin: 14.9 g/dL (ref 12.0–15.0)
MCH: 31.4 pg (ref 26.0–34.0)
MCHC: 33.8 g/dL (ref 30.0–36.0)
MCV: 93 fL (ref 78.0–100.0)
Platelets: 213 10*3/uL (ref 150–400)
RBC: 4.74 MIL/uL (ref 3.87–5.11)
RDW: 12.9 % (ref 11.5–15.5)
WBC: 11.8 10*3/uL — AB (ref 4.0–10.5)

## 2016-05-22 LAB — D-DIMER, QUANTITATIVE (NOT AT ARMC): D DIMER QUANT: 2.51 ug{FEU}/mL — AB (ref 0.00–0.50)

## 2016-05-22 LAB — CBG MONITORING, ED: GLUCOSE-CAPILLARY: 83 mg/dL (ref 65–99)

## 2016-05-22 LAB — BASIC METABOLIC PANEL
ANION GAP: 10 (ref 5–15)
BUN: 19 mg/dL (ref 6–20)
CALCIUM: 9.1 mg/dL (ref 8.9–10.3)
CO2: 25 mmol/L (ref 22–32)
Chloride: 105 mmol/L (ref 101–111)
Creatinine, Ser: 1.01 mg/dL — ABNORMAL HIGH (ref 0.44–1.00)
GFR, EST NON AFRICAN AMERICAN: 59 mL/min — AB (ref >60)
Glucose, Bld: 87 mg/dL (ref 65–99)
Potassium: 3.3 mmol/L — ABNORMAL LOW (ref 3.5–5.1)
SODIUM: 140 mmol/L (ref 135–145)

## 2016-05-22 LAB — TROPONIN I

## 2016-05-22 MED ORDER — SUCRALFATE 1 GM/10ML PO SUSP: 1.0000 g | Freq: Three times a day (TID) | ORAL | 0 refills | Status: DC

## 2016-05-22 MED ORDER — ASPIRIN 81 MG PO CHEW
324.0000 mg | CHEWABLE_TABLET | Freq: Once | ORAL | Status: AC
  Administered 2016-05-22: 324 mg via ORAL
  Filled 2016-05-22: qty 4

## 2016-05-22 MED ORDER — GI COCKTAIL ~~LOC~~
30.0000 mL | Freq: Once | ORAL | Status: AC
  Administered 2016-05-22: 30 mL via ORAL
  Filled 2016-05-22: qty 30

## 2016-05-22 MED ORDER — POTASSIUM CHLORIDE CRYS ER 20 MEQ PO TBCR
40.0000 meq | EXTENDED_RELEASE_TABLET | Freq: Once | ORAL | Status: AC
  Administered 2016-05-22: 40 meq via ORAL
  Filled 2016-05-22: qty 2

## 2016-05-22 MED ORDER — PANTOPRAZOLE SODIUM 20 MG PO TBEC: 20.0000 mg | DELAYED_RELEASE_TABLET | Freq: Every day | ORAL | 0 refills | Status: DC

## 2016-05-22 MED ORDER — NITROGLYCERIN 0.4 MG SL SUBL
0.4000 mg | SUBLINGUAL_TABLET | SUBLINGUAL | Status: DC | PRN
  Administered 2016-05-22 (×2): 0.4 mg via SUBLINGUAL
  Filled 2016-05-22: qty 1

## 2016-05-22 MED ORDER — IOPAMIDOL (ISOVUE-370) INJECTION 76%
100.0000 mL | Freq: Once | INTRAVENOUS | Status: AC | PRN
  Administered 2016-05-22: 100 mL via INTRAVENOUS

## 2016-05-22 NOTE — ED Notes (Signed)
Pt initially was not wanting to stay for second troponin, but now pt agreeing to stay after this RN provided risks of leaving without a full work up.

## 2016-05-22 NOTE — ED Notes (Signed)
ED Provider at bedside. 

## 2016-05-22 NOTE — ED Notes (Signed)
Patient transported to CT 

## 2016-05-22 NOTE — ED Notes (Signed)
Patient transported to X-ray 

## 2016-05-22 NOTE — ED Notes (Signed)
Patient refused Nitro Sl  X 3 d/t headache.

## 2016-05-22 NOTE — ED Provider Notes (Signed)
[facility] Provider Note   CSN: [id number] Arrival date & time: 05/22/16  1330     History   Chief Complaint Chief Complaint  Patient presents with  . Chest Pain    HPI Caitlin Williams is a 61 y.o. female.  HPI Patient presents to the emergency room for evaluation of chest pain and high blood pressure. Patient has history of hypertension. She is supposed be on blood pressure medications but she states she had been feeling well so she decided to stop taking those. Stopped taking her medications several months ago. She has not been monitoring her blood pressure. A few days ago however she started developing pressure in her chest.   The symptoms would come and go. She was primarily noticing it with exertion. Symptoms became more severe today so she came into the emergency room. She continues to have a pressure discomfort in her chest. She denies any leg swelling. No fevers or coughing. She denies any history of coronary artery disease. She does not smoke. She is followed by a cardiologist in [facility] for Wolff-Parkinson-White.  Past Medical History:  Diagnosis Date  . Acid reflux   . Asthma   . Cancer ([place])   . FHx: kidney cancer   . Gastroparesis   . Hypertension   . Kidney stones   . Migraine   . Pacemaker   . WPW (Wolff-Parkinson-White syndrome)     There are no active problems to display for this patient.   Past Surgical History:  Procedure Laterality Date  . ABDOMINAL HYSTERECTOMY    . AV NODE ABLATION    . CARDIAC SURGERY    . CHOLECYSTECTOMY    . HEMORRHOID SURGERY    . KIDNEY SURGERY    . OTHER SURGICAL HISTORY     open heart surgery - repair hole in heart  . PACEMAKER INSERTION     took out 1984  . PACEMAKER REMOVAL    . STOMACH SURGERY      OB History    No data available       Home Medications    Prior to Admission medications   Medication Sig Start Date End Date Taking? Authorizing Provider  ALPRAZolam (XANAX) 0.25 MG tablet Take 1  tablet (0.25 mg total) by mouth 2 (two) times daily as needed for anxiety. 03/12/15  Yes Julianne Rice, MD  acetaminophen (TYLENOL) 500 MG tablet Take 1,500 mg by mouth every 6 (six) hours as needed. Patient used this medication for pain today at 3 pm.    Historical Provider, MD  Albuterol (VENTOLIN IN) Inhale into the lungs.    Historical Provider, MD  Budesonide-Formoterol Fumarate (SYMBICORT IN) Inhale into the lungs.    Historical Provider, MD  cyclobenzaprine (FLEXERIL) 10 MG tablet Take 1 tablet (10 mg total) by mouth 2 (two) times daily as needed for muscle spasms. 12/03/15   [name], PA-C  flecainide (TAMBOCOR) 100 MG tablet Take 100 mg by mouth once. Only as needed    Historical Provider, MD  gabapentin (NEURONTIN) 300 MG capsule Take 900 mg by mouth 3 (three) times daily.     Historical Provider, MD  ibuprofen (ADVIL,MOTRIN) 600 MG tablet Take 1 tablet (600 mg total) by mouth every 6 (six) hours as needed for moderate pain. 12/03/15   [name], PA-C  lisinopril (PRINIVIL,ZESTRIL) 20 MG tablet Take 20 mg by mouth 2 (two) times daily.    Historical Provider, MD  nitroGLYCERIN (NITRODUR - DOSED IN MG/24 HR) 0.4 mg/hr Place  1 patch onto the skin as needed.      Historical Provider, MD  ondansetron (ZOFRAN-ODT) 8 MG disintegrating tablet Take 8 mg by mouth every 8 (eight) hours as needed.      Historical Provider, MD  traMADol (ULTRAM) 50 MG tablet Take 1 tablet (50 mg total) by mouth every 6 (six) hours as needed for severe pain. 12/03/15   [name], PA-C  UNKNOWN TO PATIENT "another heart medicine"    Historical Provider, MD    Family History No family history on file.  Social History Social History  Substance Use Topics  . Smoking status: Never Smoker  . Smokeless tobacco: Never Used  . Alcohol use No     Allergies   Compazine; Ketorolac tromethamine; Morphine and related; Nsaids; Toradol [ketorolac tromethamine]; Tramadol; and Demerol   Review of Systems Review of  Systems  All other systems reviewed and are negative.    Physical Exam Updated Vital Signs BP (!) 145/98   Pulse 88   Resp 16   Ht 5\' 2"  (1.575 m)   Wt 95.3 kg   SpO2 97%   BMI 38.41 kg/m   Physical Exam  Constitutional: She appears well-developed and well-nourished. No distress.  HENT:  Head: Normocephalic and atraumatic.  Right Ear: External ear normal.  Left Ear: External ear normal.  Eyes: Conjunctivae are normal. Right eye exhibits no discharge. Left eye exhibits no discharge. No scleral icterus.  Neck: Neck supple. No tracheal deviation present.  Cardiovascular: Normal rate, regular rhythm and intact distal pulses.   Pulmonary/Chest: Effort normal and breath sounds normal. No stridor. No respiratory distress. She has no wheezes. She has no rales.  Abdominal: Soft. Bowel sounds are normal. She exhibits no distension. There is no tenderness. There is no rebound and no guarding.  Musculoskeletal: She exhibits no edema or tenderness.  Neurological: She is alert. She has normal strength. No cranial nerve deficit (no facial droop, extraocular movements intact, no slurred speech) or sensory deficit. She exhibits normal muscle tone. She displays no seizure activity. Coordination normal.  Skin: Skin is warm and dry. No rash noted.  Psychiatric: She has a normal mood and affect.  Nursing note and vitals reviewed.    ED Treatments / Results  Labs (all labs ordered are listed, but only abnormal results are displayed) Labs Reviewed  BASIC METABOLIC PANEL - Abnormal; Notable for the following:       Result Value   Potassium 3.3 (*)    Creatinine, Ser 1.01 (*)    GFR calc non Af Amer 59 (*)    All other components within normal limits  CBC - Abnormal; Notable for the following:    WBC 11.8 (*)    All other components within normal limits  TROPONIN I  TROPONIN I  D-DIMER, QUANTITATIVE (NOT AT [facility])  CBG MONITORING, ED    EKG  EKG Interpretation  Date/Time:  Saturday  May 22 2016 13:37:26 EDT Ventricular Rate:  100 PR Interval:    QRS Duration: 79 QT Interval:  363 QTC Calculation: 469 R Axis:   51 Text Interpretation:  Sinus tachycardia Borderline T wave abnormalities No significant change since last tracing Confirmed by Stein Windhorst  MD-J, Vinh Sachs ([id number]) on 05/22/2016 1:56:20 PM       Radiology Dg Chest 2 View  Result Date: 05/22/2016 CLINICAL DATA:  Hypertension and chest discomfort EXAM: CHEST  2 VIEW COMPARISON:  Jun 08, 2015 FINDINGS: There is no edema or consolidation. Heart is borderline prominent with pulmonary  vascularity within normal limits. Patient is status post coronary artery bypass grafting. No adenopathy. No bone lesions. No pneumothorax. IMPRESSION: Borderline cardiac prominence.  No edema or consolidation. Electronically Signed   By: Lowella Grip III M.D.   On: 05/22/2016 14:49    Procedures Procedures (including critical care time)  Medications Ordered in ED Medications  nitroGLYCERIN (NITROSTAT) SL tablet 0.4 mg (0.4 mg Sublingual Given 05/22/16 1454)  aspirin chewable tablet 324 mg (324 mg Oral Given 05/22/16 1426)     Initial Impression / Assessment and Plan / ED Course  I have reviewed the triage vital signs and the nursing notes.  Pertinent labs & imaging results that were available during my care of the patient were reviewed by me and considered in my medical decision making (see chart for details).   Pt has a moderate risk heart score.  I discussed my concerns with her and recommended admission.  Pt states she does not want to be admitted to the hospital.  She has had this before and wants to see her cardiologist on Monday.  Pt agrees to at least have a delta troponin.  I did explain to her that it is possible she is having acute cardiac issues, MI etc.   SHe understands and does not want to be admitted.  She does not think it is her heart.  Dr Ellender Hose will follow up on the delta troponin.  D dimer added Final Clinical  Impressions(s) / ED Diagnoses   Final diagnoses:  Chest pain   dispo pending   Dorie Rank, MD 05/22/16 1549

## 2016-05-22 NOTE — ED Triage Notes (Signed)
Pt reports chest discomfort and high BP since Wednesday when she got a cortisone shot. Pt stopped taking prescribed BP meds several months ago.

## 2016-09-30 ENCOUNTER — Emergency Department (HOSPITAL_BASED_OUTPATIENT_CLINIC_OR_DEPARTMENT_OTHER): Payer: Medicaid Other

## 2016-09-30 ENCOUNTER — Emergency Department (HOSPITAL_BASED_OUTPATIENT_CLINIC_OR_DEPARTMENT_OTHER)
Admission: EM | Admit: 2016-09-30 | Discharge: 2016-09-30 | Disposition: A | Discharge status: Home / Self Care | Payer: Medicaid Other | Attending: Emergency Medicine | Admitting: Emergency Medicine

## 2016-09-30 ENCOUNTER — Encounter (HOSPITAL_BASED_OUTPATIENT_CLINIC_OR_DEPARTMENT_OTHER): Payer: Self-pay | Admitting: *Deleted

## 2016-09-30 DIAGNOSIS — Z79899 Other long term (current) drug therapy: Secondary | ICD-10-CM | POA: Insufficient documentation

## 2016-09-30 DIAGNOSIS — R072 Precordial pain: Secondary | ICD-10-CM | POA: Diagnosis not present

## 2016-09-30 DIAGNOSIS — I10 Essential (primary) hypertension: Secondary | ICD-10-CM | POA: Insufficient documentation

## 2016-09-30 DIAGNOSIS — G4489 Other headache syndrome: Secondary | ICD-10-CM | POA: Diagnosis not present

## 2016-09-30 DIAGNOSIS — Z950 Presence of cardiac pacemaker: Secondary | ICD-10-CM | POA: Diagnosis not present

## 2016-09-30 DIAGNOSIS — Z85520 Personal history of malignant carcinoid tumor of kidney: Secondary | ICD-10-CM | POA: Insufficient documentation

## 2016-09-30 DIAGNOSIS — J45909 Unspecified asthma, uncomplicated: Secondary | ICD-10-CM | POA: Diagnosis not present

## 2016-09-30 LAB — BASIC METABOLIC PANEL
Anion gap: 6 (ref 5–15)
BUN: 16 mg/dL (ref 6–20)
CHLORIDE: 108 mmol/L (ref 101–111)
CO2: 27 mmol/L (ref 22–32)
Calcium: 9 mg/dL (ref 8.9–10.3)
Creatinine, Ser: 0.98 mg/dL (ref 0.44–1.00)
GFR calc Af Amer: >60 mL/min (ref >60)
GFR calc non Af Amer: >60 mL/min (ref >60)
GLUCOSE: 108 mg/dL — AB (ref 65–99)
POTASSIUM: 3.3 mmol/L — AB (ref 3.5–5.1)
Sodium: 141 mmol/L (ref 135–145)

## 2016-09-30 LAB — CBC WITH DIFFERENTIAL/PLATELET
Basophils Absolute: 0.1 10*3/uL (ref 0.0–0.1)
Basophils Relative: 1 %
EOS ABS: 0.2 10*3/uL (ref 0.0–0.7)
EOS PCT: 3 %
HCT: 37.6 % (ref 36.0–46.0)
Hemoglobin: 12.6 g/dL (ref 12.0–15.0)
LYMPHS ABS: 3.4 10*3/uL (ref 0.7–4.0)
LYMPHS PCT: 43 %
MCH: 31.1 pg (ref 26.0–34.0)
MCHC: 33.5 g/dL (ref 30.0–36.0)
MCV: 92.8 fL (ref 78.0–100.0)
MONO ABS: 1 10*3/uL (ref 0.1–1.0)
MONOS PCT: 13 %
Neutro Abs: 3.1 10*3/uL (ref 1.7–7.7)
Neutrophils Relative %: 40 %
PLATELETS: 206 10*3/uL (ref 150–400)
RBC: 4.05 MIL/uL (ref 3.87–5.11)
RDW: 13.8 % (ref 11.5–15.5)
WBC: 7.7 10*3/uL (ref 4.0–10.5)

## 2016-09-30 LAB — TROPONIN I: Troponin I: <0.03 ng/mL (ref <0.03)

## 2016-09-30 MED ORDER — DIPHENHYDRAMINE HCL 50 MG/ML IJ SOLN
25.0000 mg | Freq: Once | INTRAMUSCULAR | Status: AC
  Administered 2016-09-30: 25 mg via INTRAVENOUS
  Filled 2016-09-30: qty 1

## 2016-09-30 MED ORDER — METOCLOPRAMIDE HCL 5 MG/ML IJ SOLN
10.0000 mg | Freq: Once | INTRAMUSCULAR | Status: AC
  Administered 2016-09-30: 10 mg via INTRAVENOUS
  Filled 2016-09-30: qty 2

## 2016-09-30 NOTE — ED Notes (Signed)
ED Provider at bedside. 

## 2016-09-30 NOTE — ED Triage Notes (Addendum)
Pt c/o " low HR" and increased BP , seen by PMD today for same , clonidine given x 2 doses in office , reports " I dont feel right"

## 2016-09-30 NOTE — ED Provider Notes (Signed)
[facility] Provider Note   CSN: [id number] Arrival date & time: 09/30/16  0012     History   Chief Complaint Chief Complaint  Patient presents with  . Hypertension    HPI Caitlin Williams is a 61 y.o. female.  The history is provided by the patient.  Hypertension  This is a new problem. The current episode started 2 days ago. The problem occurs daily. The problem has not changed since onset.Associated symptoms include chest pain, headaches and shortness of breath. Pertinent negatives include no abdominal pain. The symptoms are aggravated by standing. The symptoms are relieved by rest.  pt reports recent increase in BP over past 2 days She was supposed to have recent urologic procedure but it was cancelled due to hypertension She reports she was seen by PCP and given clonidine She has not been taking BP meds over past year She reports recent increase in stress She reports tonight her symptoms worsened including HA upon standing as well as chest heaviness/shortness of breath/diaphoresis No tearing/ripping sensation into her back is reported  Past Medical History:  Diagnosis Date  . Acid reflux   . Asthma   . Cancer ([place])   . FHx: kidney cancer   . Gastroparesis   . Hypertension   . Kidney stones   . Migraine   . Pacemaker   . WPW (Wolff-Parkinson-White syndrome)     There are no active problems to display for this patient.   Past Surgical History:  Procedure Laterality Date  . ABDOMINAL HYSTERECTOMY    . AV NODE ABLATION    . CARDIAC SURGERY    . CHOLECYSTECTOMY    . HEMORRHOID SURGERY    . KIDNEY SURGERY    . OTHER SURGICAL HISTORY     open heart surgery - repair hole in heart  . PACEMAKER INSERTION     took out 1984  . PACEMAKER REMOVAL    . STOMACH SURGERY      OB History    No data available       Home Medications    Prior to Admission medications   Medication Sig Start Date End Date Taking? Authorizing Provider  acetaminophen  (TYLENOL) 500 MG tablet Take 1,500 mg by mouth every 6 (six) hours as needed. Patient used this medication for pain today at 3 pm.    [provider]  Albuterol (VENTOLIN IN) Inhale into the lungs.    [provider]  Budesonide-Formoterol Fumarate (SYMBICORT IN) Inhale into the lungs.    [provider]  ibuprofen (ADVIL,MOTRIN) 600 MG tablet Take 1 tablet (600 mg total) by mouth every 6 (six) hours as needed for moderate pain. 12/03/15   [name], PA-C  nitroGLYCERIN (NITRODUR - DOSED IN MG/24 HR) 0.4 mg/hr Place 1 patch onto the skin as needed.      [provider]  ondansetron (ZOFRAN-ODT) 8 MG disintegrating tablet Take 8 mg by mouth every 8 (eight) hours as needed.      [provider]  pantoprazole (PROTONIX) 20 MG tablet Take 1 tablet (20 mg total) by mouth daily. 05/22/16 05/29/16  [name], MD  UNKNOWN TO PATIENT "another heart medicine"    [provider]    Family History History reviewed. No pertinent family history.  Social History Social History  Substance Use Topics  . Smoking status: Never Smoker  . Smokeless tobacco: Never Used  . Alcohol use No     Allergies   Compazine; Ketorolac tromethamine; Morphine and related;  Nsaids; Toradol [ketorolac tromethamine]; Tramadol; and Demerol   Review of Systems Review of Systems  Constitutional: Positive for diaphoresis. Negative for fever.  Respiratory: Positive for shortness of breath.   Cardiovascular: Positive for chest pain.  Gastrointestinal: Negative for abdominal pain.  Neurological: Positive for light-headedness and headaches. Negative for syncope and weakness.  All other systems reviewed and are negative.    Physical Exam Updated Vital Signs BP (!) 148/80 (BP Location: Right Arm)   Pulse 87   Temp 98.9 F (37.2 C) (Oral)   Resp 20   Ht 1.575 m (5\' 2" )   Wt 98.4 kg (217 lb)   SpO2 98%   BMI 39.69 kg/m   Physical Exam CONSTITUTIONAL: Well  developed/well nourished, anxious HEAD: Normocephalic/atraumatic EYES: EOMI/PERRL ENMT: Mucous membranes moist NECK: supple no meningeal signs SPINE/BACK:entire spine nontender CV: S1/S2 noted, no murmurs/rubs/gallops noted LUNGS: Lungs are clear to auscultation bilaterally, no apparent distress ABDOMEN: soft, nontender, no rebound or guarding, bowel sounds noted throughout abdomen, obese GU:no cva tenderness NEURO: Pt is awake/alert/appropriate, moves all extremitiesx4.  No facial droop.  No arm/leg drift.   EXTREMITIES: pulses normal/equal, full ROM SKIN: warm, color normal PSYCH: anxious, moving around in bed frequently  ED Treatments / Results  Labs (all labs ordered are listed, but only abnormal results are displayed) Labs Reviewed  BASIC METABOLIC PANEL - Abnormal; Notable for the following:       Result Value   Potassium 3.3 (*)    Glucose, Bld 108 (*)    All other components within normal limits  CBC WITH DIFFERENTIAL/PLATELET  TROPONIN I    EKG  EKG Interpretation  Date/Time:  Thursday September 30 2016 00:27:49 EDT Ventricular Rate:  91 PR Interval:    QRS Duration: 100 QT Interval:  430 QTC Calculation: 530 R Axis:   49 Text Interpretation:  Sinus rhythm Abnormal R-wave progression, early transition Borderline T wave abnormalities Confirmed by Ripley Fraise [phone]) on 09/30/2016 12:34:06 AM       Radiology Dg Chest 2 View  Result Date: 09/30/2016 CLINICAL DATA:  Patient was told 2 days ago she had hypertension. Pain. EXAM: CHEST  2 VIEW COMPARISON:  05/22/2016 FINDINGS: Postoperative changes in the mediastinum. Normal heart size and pulmonary vascularity. No focal airspace disease or consolidation in the lungs. No blunting of costophrenic angles. No pneumothorax. Mediastinal contours appear intact. Surgical clips in the upper abdomen. IMPRESSION: No active cardiopulmonary disease. Electronically Signed   By: Lucienne Capers M.D.   On: 09/30/2016 02:56     Procedures Procedures   Medications Ordered in ED Medications  metoCLOPramide (REGLAN) injection 10 mg (10 mg Intravenous Given 09/30/16 0239)  diphenhydrAMINE (BENADRYL) injection 25 mg (25 mg Intravenous Given 09/30/16 0239)     Initial Impression / Assessment and Plan / ED Course  I have reviewed the triage vital signs and the nursing notes.  Pertinent labs & imaging results that were available during my care of the patient were reviewed by me and considered in my medical decision making (see chart for details).     3:03 AM Pt with recent increases in her BP, not on daily meds Previous h/o WPW with ablation and reports "open heart surgery" in distant past for "hole in my heart" but no known CAD  When I Walk in room she is anxious, and reports chest heaviness/dyspnea.  When I discuss possible admission she adamantly refuses to be admitted and wants meds for her HA only 3:20 AM Pt improved She reports HA  resolved BP improved I did advise due to risk factors (HEART score=4) I advised admission but she again refuses We discussed risk of leaving (heart attack or something worse that can't be predicted) and she accepts this risk Her PCP has already called in BP meds for her We discussed strict ER return precautions   Final Clinical Impressions(s) / ED Diagnoses   Final diagnoses:  Essential hypertension  Precordial pain  Other headache syndrome    New Prescriptions Current Discharge Medication List       Ripley Fraise, MD 09/30/16 [phone]

## 2016-09-30 NOTE — ED Notes (Signed)
Attempted IV in left FA unsuccessful. Attempted IV in right FA unsuccessful.

## 2016-09-30 NOTE — ED Notes (Signed)
Patient transported to X-ray 

## 2016-09-30 NOTE — Discharge Instructions (Signed)

## 2017-06-23 ENCOUNTER — Encounter (HOSPITAL_BASED_OUTPATIENT_CLINIC_OR_DEPARTMENT_OTHER): Payer: Self-pay | Admitting: Emergency Medicine

## 2017-06-23 ENCOUNTER — Emergency Department (HOSPITAL_BASED_OUTPATIENT_CLINIC_OR_DEPARTMENT_OTHER)
Admission: EM | Admit: 2017-06-23 | Discharge: 2017-06-23 | Disposition: A | Discharge status: Home / Self Care | Payer: Medicaid Other | Attending: Emergency Medicine | Admitting: Emergency Medicine

## 2017-06-23 ENCOUNTER — Other Ambulatory Visit: Payer: Self-pay

## 2017-06-23 ENCOUNTER — Emergency Department (HOSPITAL_BASED_OUTPATIENT_CLINIC_OR_DEPARTMENT_OTHER): Payer: Medicaid Other

## 2017-06-23 DIAGNOSIS — Z79899 Other long term (current) drug therapy: Secondary | ICD-10-CM | POA: Insufficient documentation

## 2017-06-23 DIAGNOSIS — R1084 Generalized abdominal pain: Secondary | ICD-10-CM | POA: Diagnosis present

## 2017-06-23 DIAGNOSIS — R252 Cramp and spasm: Secondary | ICD-10-CM | POA: Diagnosis not present

## 2017-06-23 DIAGNOSIS — Z950 Presence of cardiac pacemaker: Secondary | ICD-10-CM | POA: Diagnosis not present

## 2017-06-23 DIAGNOSIS — I10 Essential (primary) hypertension: Secondary | ICD-10-CM | POA: Diagnosis not present

## 2017-06-23 DIAGNOSIS — J45909 Unspecified asthma, uncomplicated: Secondary | ICD-10-CM | POA: Insufficient documentation

## 2017-06-23 DIAGNOSIS — R109 Unspecified abdominal pain: Secondary | ICD-10-CM

## 2017-06-23 LAB — CBC WITH DIFFERENTIAL/PLATELET
BASOS ABS: 0 10*3/uL (ref 0.0–0.1)
Basophils Relative: 0 %
EOS PCT: 1 %
Eosinophils Absolute: 0.1 10*3/uL (ref 0.0–0.7)
HEMATOCRIT: 46.1 % — AB (ref 36.0–46.0)
Hemoglobin: 16 g/dL — ABNORMAL HIGH (ref 12.0–15.0)
LYMPHS ABS: 6 10*3/uL — AB (ref 0.7–4.0)
LYMPHS PCT: 48 %
MCH: 32.1 pg (ref 26.0–34.0)
MCHC: 34.7 g/dL (ref 30.0–36.0)
MCV: 92.6 fL (ref 78.0–100.0)
MONO ABS: 1.3 10*3/uL — AB (ref 0.1–1.0)
MONOS PCT: 11 %
Neutro Abs: 5 10*3/uL (ref 1.7–7.7)
Neutrophils Relative %: 40 %
PLATELETS: 255 10*3/uL (ref 150–400)
RBC: 4.98 MIL/uL (ref 3.87–5.11)
RDW: 13.1 % (ref 11.5–15.5)
WBC: 12.4 10*3/uL — ABNORMAL HIGH (ref 4.0–10.5)

## 2017-06-23 LAB — URINALYSIS, MICROSCOPIC (REFLEX)

## 2017-06-23 LAB — URINALYSIS, ROUTINE W REFLEX MICROSCOPIC
GLUCOSE, UA: 100 mg/dL — AB
KETONES UR: NEGATIVE mg/dL
Leukocytes, UA: NEGATIVE
Nitrite: NEGATIVE
PH: 5.5 (ref 5.0–8.0)
Protein, ur: NEGATIVE mg/dL
Specific Gravity, Urine: >1.03 — ABNORMAL HIGH (ref 1.005–1.030)

## 2017-06-23 LAB — BASIC METABOLIC PANEL
Anion gap: 14 (ref 5–15)
BUN: 21 mg/dL — AB (ref 6–20)
CO2: 23 mmol/L (ref 22–32)
Calcium: 9.6 mg/dL (ref 8.9–10.3)
Chloride: 102 mmol/L (ref 101–111)
Creatinine, Ser: 1.17 mg/dL — ABNORMAL HIGH (ref 0.44–1.00)
GFR calc Af Amer: 57 mL/min — ABNORMAL LOW (ref >60)
GFR calc non Af Amer: 49 mL/min — ABNORMAL LOW (ref >60)
GLUCOSE: 119 mg/dL — AB (ref 65–99)
POTASSIUM: 4 mmol/L (ref 3.5–5.1)
Sodium: 139 mmol/L (ref 135–145)

## 2017-06-23 MED ORDER — HYDROMORPHONE HCL 1 MG/ML IJ SOLN
1.0000 mg | Freq: Once | INTRAMUSCULAR | Status: AC
  Administered 2017-06-23: 1 mg via INTRAVENOUS
  Filled 2017-06-23: qty 1

## 2017-06-23 MED ORDER — SODIUM CHLORIDE 0.9 % IV BOLUS
1000.0000 mL | Freq: Once | INTRAVENOUS | Status: AC
  Administered 2017-06-23: 1000 mL via INTRAVENOUS

## 2017-06-23 MED ORDER — ONDANSETRON HCL 4 MG/2ML IJ SOLN
4.0000 mg | Freq: Once | INTRAMUSCULAR | Status: AC
  Administered 2017-06-23: 4 mg via INTRAVENOUS
  Filled 2017-06-23: qty 2

## 2017-06-23 MED ORDER — ONDANSETRON 8 MG PO TBDP: 8.0000 mg | ORAL_TABLET | Freq: Three times a day (TID) | ORAL | 0 refills | Status: DC | PRN

## 2017-06-23 MED ORDER — HYDROCODONE-ACETAMINOPHEN 5-325 MG PO TABS: 2.0000 | ORAL_TABLET | Freq: Four times a day (QID) | ORAL | 0 refills | Status: DC | PRN

## 2017-06-23 NOTE — ED Triage Notes (Signed)
Pt c/o left flank pain and leg cramping

## 2017-06-23 NOTE — ED Provider Notes (Signed)
[facility] Provider Note: Caitlin Spurling, MD, FACEP  CSN: [id number] MRN: [id number] ARRIVAL: 06/23/17 at [place]: [place]  Flank Pain   HISTORY OF PRESENT ILLNESS  06/23/17 4:39 AM Caitlin Williams is a 62 y.o. female with a history of kidney stones.  She is here with bilateral flank pain.  She started with flank pain on the left side yesterday which was intermittent but it worsened this morning and has spread to the right side.  She describes the pain as severe and like cramping.  It is similar to previous kidney stones.  She is also having cramping in her legs as well.  Pain is somewhat worse with palpation of her flanks.  Consultation with the [facility] state controlled substances database reveals the patient has received 3 opioid prescriptions in the past 2 years including 20 hydrocodone tablets on the 15th of this month.   Past Medical History:  Diagnosis Date  . Acid reflux   . Asthma   . Cancer ([place])   . FHx: kidney cancer   . Gastroparesis   . Hypertension   . Kidney stones   . Migraine   . Pacemaker   . WPW (Wolff-Parkinson-White syndrome)     Past Surgical History:  Procedure Laterality Date  . ABDOMINAL HYSTERECTOMY    . AV NODE ABLATION    . CARDIAC SURGERY    . CHOLECYSTECTOMY    . HEMORRHOID SURGERY    . KIDNEY SURGERY    . OTHER SURGICAL HISTORY     open heart surgery - repair hole in heart  . PACEMAKER INSERTION     took out 1984  . PACEMAKER REMOVAL    . STOMACH SURGERY      No family history on file.  Social History   Tobacco Use  . Smoking status: Never Smoker  . Smokeless tobacco: Never Used  Substance Use Topics  . Alcohol use: No  . Drug use: No    Prior to Admission medications   Medication Sig Start Date End Date Taking? Authorizing Provider  acetaminophen (TYLENOL) 500 MG tablet Take 1,500 mg by mouth every 6 (six) hours as needed. Patient used this medication for pain today at 3 pm.     [provider]  Albuterol (VENTOLIN IN) Inhale into the lungs.    [provider]  Budesonide-Formoterol Fumarate (SYMBICORT IN) Inhale into the lungs.    [provider]  ibuprofen (ADVIL,MOTRIN) 600 MG tablet Take 1 tablet (600 mg total) by mouth every 6 (six) hours as needed for moderate pain. 12/03/15   [name], PA-C  nitroGLYCERIN (NITRODUR - DOSED IN MG/24 HR) 0.4 mg/hr Place 1 patch onto the skin as needed.      [provider]  ondansetron (ZOFRAN-ODT) 8 MG disintegrating tablet Take 8 mg by mouth every 8 (eight) hours as needed.      [provider]  pantoprazole (PROTONIX) 20 MG tablet Take 1 tablet (20 mg total) by mouth daily. 05/22/16 05/29/16  [name], MD  UNKNOWN TO PATIENT "another heart medicine"    [provider]    Allergies Compazine; Ketorolac tromethamine; Morphine and related; Nsaids; Toradol [ketorolac tromethamine]; Tramadol; and Demerol   REVIEW OF SYSTEMS  Negative except as noted here or in the History of Present Illness.   PHYSICAL EXAMINATION  Initial Vital Signs Blood pressure (!) 160/109, pulse (!) 136, temperature 97.8 F (36.6 C), temperature source Oral, resp. rate (!) 22, height 5'  2" (1.575 m), weight 99.8 kg (220 lb), SpO2 99 %.  Examination General: Well-developed, well-nourished female in no acute distress; appearance consistent with age of record HENT: normocephalic; atraumatic Eyes: Normal appearance Neck: supple Heart: regular rate and rhythm; tachycardia Lungs: clear to auscultation bilaterally Abdomen: soft; nondistended; nontender; bowel sounds present GU: Bilateral CVA tenderness Extremities: No deformity; full range of motion Neurologic: Awake, alert and oriented; motor function intact in all extremities and symmetric; no facial droop Skin: Warm and dry  Psychiatric: Agitated   RESULTS  Summary of this visit's results, reviewed by myself:   EKG  Interpretation  Date/Time:    Ventricular Rate:    PR Interval:    QRS Duration:   QT Interval:    QTC Calculation:   R Axis:     Text Interpretation:        Laboratory Studies: Results for orders placed or performed during the hospital encounter of 06/23/17 (from the past 24 hour(s))  Basic metabolic panel     Status: Abnormal   Collection Time: 06/23/17  4:59 AM  Result Value Ref Range   Sodium 139 135 - 145 mmol/L   Potassium 4.0 3.5 - 5.1 mmol/L   Chloride 102 101 - 111 mmol/L   CO2 23 22 - 32 mmol/L   Glucose, Bld 119 (H) 65 - 99 mg/dL   BUN 21 (H) 6 - 20 mg/dL   Creatinine, Ser 1.17 (H) 0.44 - 1.00 mg/dL   Calcium 9.6 8.9 - 10.3 mg/dL   GFR calc non Af Amer 49 (L) >60 mL/min   GFR calc Af Amer 57 (L) >60 mL/min   Anion gap 14 5 - 15  CBC with Differential/Platelet     Status: Abnormal   Collection Time: 06/23/17  4:59 AM  Result Value Ref Range   WBC 12.4 (H) 4.0 - 10.5 K/uL   RBC 4.98 3.87 - 5.11 MIL/uL   Hemoglobin 16.0 (H) 12.0 - 15.0 g/dL   HCT 46.1 (H) 36.0 - 46.0 %   MCV 92.6 78.0 - 100.0 fL   MCH 32.1 26.0 - 34.0 pg   MCHC 34.7 30.0 - 36.0 g/dL   RDW 13.1 11.5 - 15.5 %   Platelets 255 150 - 400 K/uL   Neutrophils Relative % 40 %   Neutro Abs 5.0 1.7 - 7.7 K/uL   Lymphocytes Relative 48 %   Lymphs Abs 6.0 (H) 0.7 - 4.0 K/uL   Monocytes Relative 11 %   Monocytes Absolute 1.3 (H) 0.1 - 1.0 K/uL   Eosinophils Relative 1 %   Eosinophils Absolute 0.1 0.0 - 0.7 K/uL   Basophils Relative 0 %   Basophils Absolute 0.0 0.0 - 0.1 K/uL  Urinalysis, Routine w reflex microscopic     Status: Abnormal   Collection Time: 06/23/17  6:16 AM  Result Value Ref Range   Color, Urine YELLOW YELLOW   APPearance CLEAR CLEAR   Specific Gravity, Urine >1.030 (H) 1.005 - 1.030   pH 5.5 5.0 - 8.0   Glucose, UA 100 (A) NEGATIVE mg/dL   Hgb urine dipstick SMALL (A) NEGATIVE   Bilirubin Urine SMALL (A) NEGATIVE   Ketones, ur NEGATIVE NEGATIVE mg/dL   Protein, ur NEGATIVE  NEGATIVE mg/dL   Nitrite NEGATIVE NEGATIVE   Leukocytes, UA NEGATIVE NEGATIVE  Urinalysis, Microscopic (reflex)     Status: Abnormal   Collection Time: 06/23/17  6:16 AM  Result Value Ref Range   RBC / HPF 6-10 0 - 5 RBC/hpf  WBC, UA 0-5 0 - 5 WBC/hpf   Bacteria, UA MANY (A) NONE SEEN   Squamous Epithelial / LPF 0-5 0 - 5   Mucus PRESENT    Hyaline Casts, UA PRESENT    Imaging Studies: Ct Renal Stone Study  Result Date: 06/23/2017 CLINICAL DATA:  Acute onset of bilateral flank pain. EXAM: CT ABDOMEN AND PELVIS WITHOUT CONTRAST TECHNIQUE: Multidetector CT imaging of the abdomen and pelvis was performed following the standard protocol without IV contrast. COMPARISON:  CT of the abdomen and pelvis from 11/08/2016 FINDINGS: Lower chest: Minimal left basilar atelectasis is noted. The visualized portions of the mediastinum are unremarkable. The patient is status post median sternotomy. Hepatobiliary: The liver is unremarkable in appearance. The patient is status post cholecystectomy, with clips noted at the gallbladder fossa. The common bile duct remains normal in caliber. Pancreas: The pancreas is within normal limits. Spleen: The spleen is unremarkable in appearance. Adrenals/Urinary Tract: The adrenal glands are unremarkable in appearance. Nonobstructing bilateral renal stones measure up to 6 mm in size. Nonspecific perinephric stranding is noted bilaterally. Postoperative change is noted at the left kidney, reflecting prior partial nephrectomies. There is no evidence of hydronephrosis. No obstructing ureteral stones are identified. Stomach/Bowel: The stomach is unremarkable in appearance. The small bowel is within normal limits. The appendix is not visualized; there is no evidence for appendicitis. Scattered diverticulosis is noted along the sigmoid colon, without evidence of diverticulitis. Vascular/Lymphatic: The abdominal aorta is unremarkable in appearance. The inferior vena cava is grossly  unremarkable. No retroperitoneal lymphadenopathy is seen. No pelvic sidewall lymphadenopathy is identified. Reproductive: The bladder is decompressed and not well characterized. The patient is status post hysterectomy. No suspicious adnexal masses are seen. Other: No additional soft tissue abnormalities are seen. Musculoskeletal: No acute osseous abnormalities are identified. The visualized musculature is unremarkable in appearance. IMPRESSION: 1. No acute abnormality seen to explain the patient's symptoms. 2. Nonobstructing bilateral renal stones measure up to 6 mm in size. 3. Scattered diverticulosis along the sigmoid colon, without evidence of diverticulitis. Electronically Signed   By: Garald Balding M.D.   On: 06/23/2017 05:24    ED COURSE and MDM  Nursing notes and initial vitals signs, including pulse oximetry, reviewed.  Vitals:   06/23/17 0435 06/23/17 0436  BP:  (!) 160/109  Pulse:  (!) 136  Resp:  (!) 22  Temp:  97.8 F (36.6 C)  TempSrc:  Oral  SpO2:  99%  Weight: 99.8 kg (220 lb)   Height: 5\' 2"  (1.575 m)    6:39 AM The cause of the patient's pain is not obvious from lab work and CT scan.  The CT shows no ureterolithiasis though it is possible she passed a stone given that she has a small amount of microscopic hematuria.  The location of her pain is bilateral as well as in her legs.  She describes it is cramping.  She has not been in the sun recently.  Her BMET does not show an electrolyte abnormality but her tachycardia and urine specific gravity do suggest dehydration and she was advised to increase her fluid intake.  Her tachycardia is improved after a normal saline bolus.  She was recently started on clonidine for elevated blood pressure.  PROCEDURES    ED DIAGNOSES     ICD-10-CM   1. Left flank pain R10.9   2. Muscle cramps R25.2        Shanon Rosser, MD 06/23/17 [phone]

## 2017-07-21 ENCOUNTER — Emergency Department (HOSPITAL_BASED_OUTPATIENT_CLINIC_OR_DEPARTMENT_OTHER): Payer: Medicaid Other

## 2017-07-21 ENCOUNTER — Encounter (HOSPITAL_BASED_OUTPATIENT_CLINIC_OR_DEPARTMENT_OTHER): Payer: Self-pay | Admitting: *Deleted

## 2017-07-21 ENCOUNTER — Other Ambulatory Visit: Payer: Self-pay

## 2017-07-21 ENCOUNTER — Emergency Department (HOSPITAL_BASED_OUTPATIENT_CLINIC_OR_DEPARTMENT_OTHER)
Admission: EM | Admit: 2017-07-21 | Discharge: 2017-07-21 | Disposition: A | Discharge status: Home / Self Care | Payer: Medicaid Other | Attending: Emergency Medicine | Admitting: Emergency Medicine

## 2017-07-21 DIAGNOSIS — Y999 Unspecified external cause status: Secondary | ICD-10-CM | POA: Diagnosis not present

## 2017-07-21 DIAGNOSIS — Y9301 Activity, walking, marching and hiking: Secondary | ICD-10-CM | POA: Insufficient documentation

## 2017-07-21 DIAGNOSIS — J45909 Unspecified asthma, uncomplicated: Secondary | ICD-10-CM | POA: Insufficient documentation

## 2017-07-21 DIAGNOSIS — S8001XA Contusion of right knee, initial encounter: Secondary | ICD-10-CM | POA: Diagnosis not present

## 2017-07-21 DIAGNOSIS — Y92009 Unspecified place in unspecified non-institutional (private) residence as the place of occurrence of the external cause: Secondary | ICD-10-CM | POA: Insufficient documentation

## 2017-07-21 DIAGNOSIS — Z85528 Personal history of other malignant neoplasm of kidney: Secondary | ICD-10-CM | POA: Diagnosis not present

## 2017-07-21 DIAGNOSIS — I10 Essential (primary) hypertension: Secondary | ICD-10-CM | POA: Insufficient documentation

## 2017-07-21 DIAGNOSIS — S8991XA Unspecified injury of right lower leg, initial encounter: Secondary | ICD-10-CM | POA: Diagnosis present

## 2017-07-21 DIAGNOSIS — W01198A Fall on same level from slipping, tripping and stumbling with subsequent striking against other object, initial encounter: Secondary | ICD-10-CM | POA: Diagnosis not present

## 2017-07-21 DIAGNOSIS — Z950 Presence of cardiac pacemaker: Secondary | ICD-10-CM | POA: Diagnosis not present

## 2017-07-21 MED ORDER — DICLOFENAC SODIUM 1 % TD GEL: 4.0000 g | Freq: Four times a day (QID) | TRANSDERMAL | 1 refills | Status: DC

## 2017-07-21 NOTE — ED Notes (Signed)
Patient transported to X-ray 

## 2017-07-21 NOTE — ED Provider Notes (Signed)
[facility] Provider Note   CSN: [id number] Arrival date & time: 07/21/17  2040     History   Chief Complaint Chief Complaint  Patient presents with  . Fall    HPI Caitlin Williams is a 62 y.o. female.  Patient is a 62 year old female with a history of gastroparesis, hypertension, kidney stones, asthma who is presenting today after mechanical fall.  Patient has just mopped her floor and was walking across it with crocs on and she slipped causing her to fall hitting her bent right knee into the freezer.  She has had significant pain and swelling in the knee since the fall.  She states she did also fall on her tailbone which is sore.  She did hit her head but denies any loss of consciousness.  She states she only hit it mildly.  She denies any neck pain.  No numbness or tingling in the arms or legs.  She has had no nausea or vomiting.  She denies any chest or abdominal pain.  Patient states she is having difficulty walking on her her right leg due to the pain in her knee.  She denies any pain in the left leg or pain in her right ankle or hip.  The history is provided by the patient.  Fall  This is a new problem. The current episode started 6 to 12 hours ago. The problem occurs constantly. The problem has not changed since onset.Associated symptoms comments: Sacral pain and right knee pain. The symptoms are aggravated by walking, bending and twisting. The symptoms are relieved by rest. She has tried rest for the symptoms. The treatment provided no relief.    Past Medical History:  Diagnosis Date  . Acid reflux   . Asthma   . Cancer ([place])   . FHx: kidney cancer   . Gastroparesis   . Hypertension   . Kidney stones   . Migraine   . Pacemaker   . WPW (Wolff-Parkinson-White syndrome)     There are no active problems to display for this patient.   Past Surgical History:  Procedure Laterality Date  . ABDOMINAL HYSTERECTOMY    . AV NODE ABLATION    .  CARDIAC SURGERY    . CHOLECYSTECTOMY    . HEMORRHOID SURGERY    . KIDNEY SURGERY    . OTHER SURGICAL HISTORY     open heart surgery - repair hole in heart  . PACEMAKER INSERTION     took out 1984  . PACEMAKER REMOVAL    . STOMACH SURGERY       OB History   None      Home Medications    Prior to Admission medications   Medication Sig Start Date End Date Taking? Authorizing Provider  acetaminophen (TYLENOL) 500 MG tablet Take 1,500 mg by mouth every 6 (six) hours as needed. Patient used this medication for pain today at 3 pm.    [provider]  Albuterol (VENTOLIN IN) Inhale into the lungs.    [provider]  Budesonide-Formoterol Fumarate (SYMBICORT IN) Inhale into the lungs.    [provider]  diclofenac sodium (VOLTAREN) 1 % GEL Apply 4 g topically 4 (four) times daily. 07/21/17   Blanchie Dessert, MD  HYDROcodone-acetaminophen (NORCO) 5-325 MG tablet Take 2 tablets by mouth every 6 (six) hours as needed (for pain). 06/23/17   Molpus, John, MD  ibuprofen (ADVIL,MOTRIN) 600 MG tablet Take 1 tablet (600 mg total) by mouth every 6 (six)  hours as needed for moderate pain. 12/03/15   [name], PA-C  nitroGLYCERIN (NITRODUR - DOSED IN MG/24 HR) 0.4 mg/hr Place 1 patch onto the skin as needed.      [provider]  ondansetron (ZOFRAN-ODT) 8 MG disintegrating tablet Take 1 tablet (8 mg total) by mouth every 8 (eight) hours as needed for nausea or vomiting. 06/23/17   Molpus, John, MD  pantoprazole (PROTONIX) 20 MG tablet Take 1 tablet (20 mg total) by mouth daily. 05/22/16 05/29/16  [name], MD  UNKNOWN TO PATIENT "another heart medicine"    [provider]    Family History No family history on file.  Social History Social History   Tobacco Use  . Smoking status: Never Smoker  . Smokeless tobacco: Never Used  Substance Use Topics  . Alcohol use: No  . Drug use: No     Allergies   Compazine; Ketorolac tromethamine;  Morphine and related; Nsaids; Toradol [ketorolac tromethamine]; Tramadol; and Demerol   Review of Systems Review of Systems  All other systems reviewed and are negative.    Physical Exam Updated Vital Signs BP (!) 159/96   Pulse (!) 106   Temp 98.2 F (36.8 C)   Resp 18   Ht 5\' 2"  (1.575 m)   Wt 99.8 kg (220 lb)   SpO2 95%   BMI 40.24 kg/m   Physical Exam  Constitutional: She is oriented to person, place, and time. She appears well-developed and well-nourished. No distress.  HENT:  Head: Normocephalic and atraumatic.  Mouth/Throat: Oropharynx is clear and moist.  Eyes: Pupils are equal, round, and reactive to light. Conjunctivae and EOM are normal.  Neck: Normal range of motion. Neck supple. No spinous process tenderness and no muscular tenderness present. Normal range of motion present.  Cardiovascular: Normal rate, regular rhythm and intact distal pulses.  No murmur heard. Pulmonary/Chest: Effort normal and breath sounds normal. No respiratory distress. She has no wheezes. She has no rales.  Abdominal: Soft. She exhibits no distension. There is no tenderness. There is no rebound and no guarding.  Musculoskeletal: She exhibits tenderness. She exhibits no edema.       Right knee: She exhibits decreased range of motion, swelling and bony tenderness. She exhibits no effusion, no ecchymosis, no deformity, no LCL laxity and no MCL laxity. Tenderness found. Medial joint line, lateral joint line and patellar tendon tenderness noted.  Patient is able to bend the right knee to approximately 30 degrees and then developed significant pain.  She can flex fully.  Neurological: She is alert and oriented to person, place, and time.  Skin: Skin is warm and dry. Capillary refill takes less than 2 seconds. No rash noted. No erythema.  Psychiatric: She has a normal mood and affect. Her behavior is normal.  Nursing note and vitals reviewed.    ED Treatments / Results  Labs (all labs ordered  are listed, but only abnormal results are displayed) Labs Reviewed - No data to display  EKG None  Radiology Dg Knee Complete 4 Views Right  Result Date: 07/21/2017 CLINICAL DATA:  Patient slipped on wet floor injuring right knee. EXAM: RIGHT KNEE - COMPLETE 4+ VIEW COMPARISON:  06/19/2011 FINDINGS: No acute fracture nor joint dislocation. No effusion. Tiny ossific densities adjacent to the lateral tibial spine are present dating back to 2013 consistent with old remote injury. Femorotibial and patellofemoral joint space narrowing consistent with osteoarthritis. Mild soft tissue swelling along the anteromedial aspect of the knee. IMPRESSION: No  acute fracture or joint dislocation. Mild osteoarthritic joint space narrowing of the right knee. No joint effusion. Electronically Signed   By: Ashley Royalty M.D.   On: 07/21/2017 21:31    Procedures Procedures (including critical care time)  Medications Ordered in ED Medications - No data to display   Initial Impression / Assessment and Plan / ED Course  I have reviewed the triage vital signs and the nursing notes.  Pertinent labs & imaging results that were available during my care of the patient were reviewed by me and considered in my medical decision making (see chart for details).     Patient with a mechanical fall earlier today with injury to the right knee.  She is neurovascularly intact with low suspicion for tendon injury.  Patient's x-ray shows no acute fracture dislocation but does show arthritis and joint space narrowing.  Findings discussed with the patient.  She already has crutches in a knee sleeve at home.  Recommended elevation and ice.  Patient will follow-up with her orthopedist if symptoms do not improve.  Final Clinical Impressions(s) / ED Diagnoses   Final diagnoses:  Contusion of right knee, initial encounter    ED Discharge Orders        Ordered    diclofenac sodium (VOLTAREN) 1 % GEL  4 times daily     07/21/17 2143        Blanchie Dessert, MD 07/21/17 2340

## 2017-07-21 NOTE — ED Triage Notes (Signed)
She slipped on a wet floor. Injury to her right knee. Swelling to her lower leg.

## 2017-07-21 NOTE — Discharge Instructions (Signed)
Use your crutches as needed and the knee sleeve.  Elevate your leg and ice it.

## 2017-09-20 ENCOUNTER — Emergency Department (HOSPITAL_BASED_OUTPATIENT_CLINIC_OR_DEPARTMENT_OTHER)
Admission: EM | Admit: 2017-09-20 | Discharge: 2017-09-20 | Disposition: A | Discharge status: Home / Self Care | Payer: Medicaid Other | Attending: Emergency Medicine | Admitting: Emergency Medicine

## 2017-09-20 ENCOUNTER — Encounter (HOSPITAL_BASED_OUTPATIENT_CLINIC_OR_DEPARTMENT_OTHER): Payer: Self-pay | Admitting: *Deleted

## 2017-09-20 ENCOUNTER — Emergency Department (HOSPITAL_BASED_OUTPATIENT_CLINIC_OR_DEPARTMENT_OTHER): Payer: Medicaid Other

## 2017-09-20 ENCOUNTER — Other Ambulatory Visit: Payer: Self-pay

## 2017-09-20 DIAGNOSIS — I10 Essential (primary) hypertension: Secondary | ICD-10-CM | POA: Insufficient documentation

## 2017-09-20 DIAGNOSIS — Z9049 Acquired absence of other specified parts of digestive tract: Secondary | ICD-10-CM | POA: Diagnosis not present

## 2017-09-20 DIAGNOSIS — Z950 Presence of cardiac pacemaker: Secondary | ICD-10-CM | POA: Insufficient documentation

## 2017-09-20 DIAGNOSIS — M7918 Myalgia, other site: Secondary | ICD-10-CM | POA: Diagnosis not present

## 2017-09-20 DIAGNOSIS — R0789 Other chest pain: Secondary | ICD-10-CM

## 2017-09-20 DIAGNOSIS — I456 Pre-excitation syndrome: Secondary | ICD-10-CM | POA: Insufficient documentation

## 2017-09-20 DIAGNOSIS — J45909 Unspecified asthma, uncomplicated: Secondary | ICD-10-CM | POA: Insufficient documentation

## 2017-09-20 DIAGNOSIS — R079 Chest pain, unspecified: Secondary | ICD-10-CM | POA: Diagnosis present

## 2017-09-20 DIAGNOSIS — Z79899 Other long term (current) drug therapy: Secondary | ICD-10-CM | POA: Insufficient documentation

## 2017-09-20 LAB — CBC
HEMATOCRIT: 41.8 % (ref 36.0–46.0)
Hemoglobin: 14.2 g/dL (ref 12.0–15.0)
MCH: 32.5 pg (ref 26.0–34.0)
MCHC: 34 g/dL (ref 30.0–36.0)
MCV: 95.7 fL (ref 78.0–100.0)
PLATELETS: 213 10*3/uL (ref 150–400)
RBC: 4.37 MIL/uL (ref 3.87–5.11)
RDW: 13.6 % (ref 11.5–15.5)
WBC: 7.2 10*3/uL (ref 4.0–10.5)

## 2017-09-20 LAB — D-DIMER, QUANTITATIVE: D-Dimer, Quant: 0.33 ug/mL-FEU (ref 0.00–0.50)

## 2017-09-20 LAB — BASIC METABOLIC PANEL
Anion gap: 10 (ref 5–15)
BUN: 16 mg/dL (ref 8–23)
CHLORIDE: 105 mmol/L (ref 98–111)
CO2: 27 mmol/L (ref 22–32)
CREATININE: 0.96 mg/dL (ref 0.44–1.00)
Calcium: 9.3 mg/dL (ref 8.9–10.3)
GFR calc non Af Amer: >60 mL/min (ref >60)
Glucose, Bld: 128 mg/dL — ABNORMAL HIGH (ref 70–99)
POTASSIUM: 3.4 mmol/L — AB (ref 3.5–5.1)
SODIUM: 142 mmol/L (ref 135–145)

## 2017-09-20 LAB — TROPONIN I: Troponin I: <0.03 ng/mL (ref <0.03)

## 2017-09-20 MED ORDER — HYDROCODONE-ACETAMINOPHEN 5-325 MG PO TABS
1.0000 | ORAL_TABLET | Freq: Once | ORAL | Status: AC
  Administered 2017-09-20: 1 via ORAL
  Filled 2017-09-20: qty 1

## 2017-09-20 MED ORDER — DIAZEPAM 2 MG PO TABS
2.0000 mg | ORAL_TABLET | Freq: Once | ORAL | Status: AC
  Administered 2017-09-20: 2 mg via ORAL
  Filled 2017-09-20: qty 1

## 2017-09-20 MED ORDER — CYCLOBENZAPRINE HCL 10 MG PO TABS: 10.0000 mg | ORAL_TABLET | Freq: Two times a day (BID) | ORAL | 0 refills | Status: DC | PRN

## 2017-09-20 MED ORDER — HYDROCODONE-ACETAMINOPHEN 5-325 MG PO TABS: 1.0000 | ORAL_TABLET | Freq: Four times a day (QID) | ORAL | 0 refills | Status: DC | PRN

## 2017-09-20 NOTE — ED Triage Notes (Addendum)
Chest pain with radiation into her left shoulder and back. She feels SOB. She had a normal heart cath in November 2018.

## 2017-09-20 NOTE — ED Provider Notes (Signed)
[facility] EMERGENCY DEPARTMENT Provider Note   CSN: [id number] Arrival date & time: 09/20/17  1414     History   Chief Complaint Chief Complaint  Patient presents with  . Chest Pain  . Shortness of Breath    HPI Caitlin Williams is a 62 y.o. female.  Patient reports onset of substernal chest discomfort onset yesterday, worsening this morning with a feeling of shortness of breath. Pain is described as "someone sitting on my chest". Pain now radiating to left shoulder and back. She lifted a heavy object yesterday, with pain beginning afterward. She has taken NTG and ibuprofen without change in symptoms.  The history is provided by the patient and medical records. No language interpreter was used.  Chest Pain   This is a recurrent problem. The current episode started yesterday. The problem has been gradually worsening. The pain is associated with movement. The pain is present in the substernal region. The pain is moderate. The quality of the pain is described as heavy. The pain radiates to the left shoulder and upper back. Associated symptoms include shortness of breath. She has tried nitroglycerin for the symptoms. The treatment provided no relief. Risk factors include obesity, stress and post-menopausal.  Her past medical history is significant for cancer.  Shortness of Breath  Associated symptoms include chest pain.    Past Medical History:  Diagnosis Date  . Acid reflux   . Asthma   . Cancer ([place])   . FHx: kidney cancer   . Gastroparesis   . Hypertension   . Kidney stones   . Migraine   . Pacemaker   . WPW (Wolff-Parkinson-White syndrome)     There are no active problems to display for this patient.   Past Surgical History:  Procedure Laterality Date  . ABDOMINAL HYSTERECTOMY    . AV NODE ABLATION    . CARDIAC SURGERY    . CHOLECYSTECTOMY    . HEMORRHOID SURGERY    . KIDNEY SURGERY    . OTHER SURGICAL HISTORY     open heart surgery - repair hole in  heart  . PACEMAKER INSERTION     took out 1984  . PACEMAKER REMOVAL    . STOMACH SURGERY       OB History   None      Home Medications    Prior to Admission medications   Medication Sig Start Date End Date Taking? Authorizing Provider  acetaminophen (TYLENOL) 500 MG tablet Take 1,500 mg by mouth every 6 (six) hours as needed. Patient used this medication for pain today at 3 pm.    [provider]  Albuterol (VENTOLIN IN) Inhale into the lungs.    [provider]  Budesonide-Formoterol Fumarate (SYMBICORT IN) Inhale into the lungs.    [provider]  diclofenac sodium (VOLTAREN) 1 % GEL Apply 4 g topically 4 (four) times daily. 07/21/17   Blanchie Dessert, MD  HYDROcodone-acetaminophen (NORCO) 5-325 MG tablet Take 2 tablets by mouth every 6 (six) hours as needed (for pain). 06/23/17   Molpus, John, MD  ibuprofen (ADVIL,MOTRIN) 600 MG tablet Take 1 tablet (600 mg total) by mouth every 6 (six) hours as needed for moderate pain. 12/03/15   [name], PA-C  nitroGLYCERIN (NITRODUR - DOSED IN MG/24 HR) 0.4 mg/hr Place 1 patch onto the skin as needed.      [provider]  ondansetron (ZOFRAN-ODT) 8 MG disintegrating tablet Take 1 tablet (8 mg total) by mouth every 8 (eight) hours as  needed for nausea or vomiting. 06/23/17   Molpus, John, MD  pantoprazole (PROTONIX) 20 MG tablet Take 1 tablet (20 mg total) by mouth daily. 05/22/16 05/29/16  [name], MD  UNKNOWN TO PATIENT "another heart medicine"    [provider]    Family History No family history on file.  Social History Social History   Tobacco Use  . Smoking status: Never Smoker  . Smokeless tobacco: Never Used  Substance Use Topics  . Alcohol use: No  . Drug use: No     Allergies   Compazine; Ketorolac tromethamine; Morphine and related; Nsaids; Toradol [ketorolac tromethamine]; Tramadol; and Demerol   Review of Systems Review of Systems  Respiratory: Positive for  shortness of breath.   Cardiovascular: Positive for chest pain.  All other systems reviewed and are negative.    Physical Exam Updated Vital Signs BP (!) 161/83   Pulse (!) 116   Temp 97.8 F (36.6 C) (Oral)   Resp 20   Ht 5\' 2"  (1.575 m)   Wt 99.8 kg   SpO2 94%   BMI 40.24 kg/m   Physical Exam  Constitutional: She is oriented to person, place, and time. She appears well-developed and well-nourished. She does not appear ill.  HENT:  Head: Atraumatic.  Eyes: Conjunctivae are normal.  Neck: Neck supple.  Cardiovascular: Regular rhythm and intact distal pulses. Tachycardia present.  No murmur heard. Pulmonary/Chest: Effort normal and breath sounds normal.  Abdominal: Soft. Bowel sounds are normal.  Musculoskeletal: Normal range of motion.  Neurological: She is alert and oriented to person, place, and time.  Skin: Skin is warm and dry.  Psychiatric: She has a normal mood and affect.  Nursing note and vitals reviewed.    ED Treatments / Results  Labs (all labs ordered are listed, but only abnormal results are displayed) Labs Reviewed  BASIC METABOLIC PANEL - Abnormal; Notable for the following components:      Result Value   Potassium 3.4 (*)    Glucose, Bld 128 (*)    All other components within normal limits  CBC  TROPONIN I  D-DIMER, QUANTITATIVE (NOT AT [facility])    EKG None  Radiology Dg Chest 2 View  Result Date: 09/20/2017 CLINICAL DATA:  Chest pain and shortness of breath for the past several days. Nonsmoker. History of asthma, hypertension. EXAM: CHEST - 2 VIEW COMPARISON:  Chest x-ray of Jun 07, 2017 FINDINGS: The lungs are borderline hypoinflated but clear. The heart and pulmonary vascularity are normal. The mediastinum is normal in width. The patient has undergone previous median sternotomy. The bony thorax is unremarkable. IMPRESSION: There is no active cardiopulmonary disease. Electronically Signed   By: Alayza Pieper  [place] M.D.   On: 09/20/2017 14:46     Procedures Procedures (including critical care time)  Medications Ordered in ED Medications - No data to display   Initial Impression / Assessment and Plan / ED Course  I have reviewed the triage vital signs and the nursing notes.  Pertinent labs & imaging results that were available during my care of the patient were reviewed by me and considered in my medical decision making (see chart for details).    Patient discussed with Dr. Ralene Bathe.  Patient with chest pain onset yesterday, worsening today. Patient with tachycardia and shortness of breath. D-dimer negative. History of tachycardia and WPW. No acute changes on ECG. Initial troponin negative. Will recheck at 3 hours. Negative delta troponin.  Patient is to be discharged with recommendation to follow  up with PCP in regards to today's hospital visit. Chest pain is not likely of cardiac or pulmonary etiology d/t presentation, perc negative, VSS, no tracheal deviation, no JVD or new murmur, RRR, breath sounds equal bilaterally, EKG without acute abnormalities, negative troponin, and negative CXR. Pain appears to be musculoskeletal in nature. Pt has been advised to return to the ED is CP becomes exertional, associated with diaphoresis or nausea, radiates to left jaw/arm, worsens or becomes concerning in any way. Pt appears reliable for follow up and is agreeable to discharge.   Case has been discussed with and seen by Dr. Ralene Bathe who agrees with the above plan to discharge.   Final Clinical Impressions(s) / ED Diagnoses   Final diagnoses:  Musculoskeletal chest pain    ED Discharge Orders         Ordered    cyclobenzaprine (FLEXERIL) 10 MG tablet  2 times daily PRN     09/20/17 1829    HYDROcodone-acetaminophen (NORCO) 5-325 MG tablet  Every 6 hours PRN     09/20/17 1829           Etta Quill, NP 09/20/17 Kathaleen Maser    Quintella Reichert, MD 09/22/17 1525

## 2017-09-20 NOTE — ED Notes (Signed)
Pt ambulated to BR with no difficulty.  

## 2017-09-20 NOTE — Discharge Instructions (Signed)
Use ibuprofen and muscle relaxant for chest wall pain. Reserve vicodin for pain that does not improve with the ibuprofen.

## 2017-12-01 ENCOUNTER — Emergency Department (HOSPITAL_BASED_OUTPATIENT_CLINIC_OR_DEPARTMENT_OTHER): Payer: Medicaid Other

## 2017-12-01 ENCOUNTER — Emergency Department (HOSPITAL_BASED_OUTPATIENT_CLINIC_OR_DEPARTMENT_OTHER)
Admission: EM | Admit: 2017-12-01 | Discharge: 2017-12-01 | Disposition: A | Discharge status: Home / Self Care | Payer: Medicaid Other | Attending: Emergency Medicine | Admitting: Emergency Medicine

## 2017-12-01 ENCOUNTER — Other Ambulatory Visit: Payer: Self-pay

## 2017-12-01 ENCOUNTER — Encounter (HOSPITAL_BASED_OUTPATIENT_CLINIC_OR_DEPARTMENT_OTHER): Payer: Self-pay | Admitting: Emergency Medicine

## 2017-12-01 DIAGNOSIS — R109 Unspecified abdominal pain: Secondary | ICD-10-CM | POA: Diagnosis present

## 2017-12-01 DIAGNOSIS — R112 Nausea with vomiting, unspecified: Secondary | ICD-10-CM | POA: Insufficient documentation

## 2017-12-01 DIAGNOSIS — Z79899 Other long term (current) drug therapy: Secondary | ICD-10-CM | POA: Insufficient documentation

## 2017-12-01 DIAGNOSIS — I10 Essential (primary) hypertension: Secondary | ICD-10-CM | POA: Diagnosis not present

## 2017-12-01 DIAGNOSIS — Z950 Presence of cardiac pacemaker: Secondary | ICD-10-CM | POA: Diagnosis not present

## 2017-12-01 DIAGNOSIS — R1013 Epigastric pain: Secondary | ICD-10-CM | POA: Insufficient documentation

## 2017-12-01 DIAGNOSIS — J45909 Unspecified asthma, uncomplicated: Secondary | ICD-10-CM | POA: Insufficient documentation

## 2017-12-01 LAB — CBC WITH DIFFERENTIAL/PLATELET
ABS IMMATURE GRANULOCYTES: 0.05 10*3/uL (ref 0.00–0.07)
BASOS ABS: 0.1 10*3/uL (ref 0.0–0.1)
Basophils Relative: 1 %
EOS PCT: 2 %
Eosinophils Absolute: 0.1 10*3/uL (ref 0.0–0.5)
HEMATOCRIT: 42.9 % (ref 36.0–46.0)
Hemoglobin: 14 g/dL (ref 12.0–15.0)
Immature Granulocytes: 1 %
LYMPHS ABS: 2.9 10*3/uL (ref 0.7–4.0)
LYMPHS PCT: 39 %
MCH: 30.8 pg (ref 26.0–34.0)
MCHC: 32.6 g/dL (ref 30.0–36.0)
MCV: 94.5 fL (ref 80.0–100.0)
MONO ABS: 1 10*3/uL (ref 0.1–1.0)
Monocytes Relative: 13 %
NEUTROS ABS: 3.4 10*3/uL (ref 1.7–7.7)
NRBC: 0 % (ref 0.0–0.2)
Neutrophils Relative %: 44 %
Platelets: 226 10*3/uL (ref 150–400)
RBC: 4.54 MIL/uL (ref 3.87–5.11)
RDW: 13 % (ref 11.5–15.5)
WBC: 7.6 10*3/uL (ref 4.0–10.5)

## 2017-12-01 LAB — I-STAT CHEM 8, ED
BUN: 10 mg/dL (ref 8–23)
CALCIUM ION: 1.13 mmol/L — AB (ref 1.15–1.40)
CALCIUM ION: 1.1 mmol/L — AB (ref 1.15–1.40)
CHLORIDE: 104 mmol/L (ref 98–111)
Chloride: 92 mmol/L — ABNORMAL LOW (ref 98–111)
Creatinine, Ser: 1 mg/dL (ref 0.44–1.00)
Creatinine, Ser: 0.8 mg/dL (ref 0.44–1.00)
GLUCOSE: 106 mg/dL — AB (ref 70–99)
Glucose, Bld: 96 mg/dL (ref 70–99)
HCT: 43 % (ref 36.0–46.0)
HCT: 44 % (ref 36.0–46.0)
HEMOGLOBIN: 15 g/dL (ref 12.0–15.0)
Hemoglobin: 14.6 g/dL (ref 12.0–15.0)
POTASSIUM: 2.9 mmol/L — AB (ref 3.5–5.1)
Potassium: 3 mmol/L — ABNORMAL LOW (ref 3.5–5.1)
SODIUM: 138 mmol/L (ref 135–145)
Sodium: 142 mmol/L (ref 135–145)
TCO2: 27 mmol/L (ref 22–32)
TCO2: 34 mmol/L — AB (ref 22–32)

## 2017-12-01 MED ORDER — ONDANSETRON 4 MG PO TBDP: 4.0000 mg | ORAL_TABLET | Freq: Three times a day (TID) | ORAL | 0 refills | Status: AC | PRN

## 2017-12-01 MED ORDER — FENTANYL CITRATE (PF) 100 MCG/2ML IJ SOLN
100.0000 ug | Freq: Once | INTRAMUSCULAR | Status: AC
  Administered 2017-12-01: 100 ug via INTRAVENOUS
  Filled 2017-12-01: qty 2

## 2017-12-01 MED ORDER — ONDANSETRON HCL 4 MG/2ML IJ SOLN
4.0000 mg | Freq: Once | INTRAMUSCULAR | Status: AC
  Administered 2017-12-01: 4 mg via INTRAVENOUS
  Filled 2017-12-01: qty 2

## 2017-12-01 MED ORDER — IOPAMIDOL (ISOVUE-300) INJECTION 61%
100.0000 mL | Freq: Once | INTRAVENOUS | Status: AC | PRN
  Administered 2017-12-01: 100 mL via INTRAVENOUS

## 2017-12-01 MED ORDER — POTASSIUM CHLORIDE CRYS ER 20 MEQ PO TBCR: 40.0000 meq | EXTENDED_RELEASE_TABLET | Freq: Every day | ORAL | 0 refills | Status: AC

## 2017-12-01 MED FILL — ONDANSETRON ODT 4 MG TABLET: 4 | 6 days supply | Qty: 20 | Fill #0

## 2017-12-01 MED FILL — POTASSIUM CL ER 20 MEQ TAB: 20 | 5 days supply | Qty: 10 | Fill #0

## 2017-12-01 NOTE — ED Triage Notes (Signed)
Pt c/p epigastric pain x 3 days worsening over last hour. Pt has taken Rolaids and home GI meds without relief. Pt states pain increases after eating.

## 2017-12-01 NOTE — ED Provider Notes (Signed)
[facility] EMERGENCY DEPARTMENT Provider Note   CSN: [id number] Arrival date & time: 12/01/17  0457     History   Chief Complaint Chief Complaint  Patient presents with  . Abdominal Pain    HPI Caitlin Williams is a 62 y.o. female.  The history is provided by the patient and a significant other.  Abdominal Pain   This is a new problem. The current episode started more than 2 days ago. The problem occurs daily. The problem has been gradually worsening. The pain is associated with eating. The pain is located in the epigastric region. The pain is severe. Associated symptoms include diarrhea, nausea and myalgias. Pertinent negatives include fever, hematochezia, melena, vomiting, constipation and dysuria. The symptoms are aggravated by eating and palpation. Nothing relieves the symptoms.   PT With history of esophageal reflux, asthma, gastroparesis, hypertension presents with abdominal pain. She reports for the past 3 days she is having worsening pain, worse with food.  She reports nausea without vomiting.  No new chest pain but feels her heart rate elevating.  No lower abdominal pain.  No dysuria. She took her home reflux medications without any improvement Past Medical History:  Diagnosis Date  . Acid reflux   . Asthma   . Cancer ([place])   . FHx: kidney cancer   . Gastroparesis   . Hypertension   . Kidney stones   . Migraine   . Pacemaker   . WPW (Wolff-Parkinson-White syndrome)     There are no active problems to display for this patient.   Past Surgical History:  Procedure Laterality Date  . ABDOMINAL HYSTERECTOMY    . AV NODE ABLATION    . CARDIAC SURGERY    . CHOLECYSTECTOMY    . HEMORRHOID SURGERY    . KIDNEY SURGERY    . OTHER SURGICAL HISTORY     open heart surgery - repair hole in heart  . PACEMAKER INSERTION     took out 1984  . PACEMAKER REMOVAL    . STOMACH SURGERY       OB History   None      Home Medications    Prior to Admission  medications   Medication Sig Start Date End Date Taking? Authorizing Provider  carvedilol (COREG) 6.25 MG tablet Take 6.25 mg by mouth 2 (two) times daily with a meal.   Yes [provider]  cloNIDine (CATAPRES) 0.1 MG tablet Take 0.1 mg by mouth as needed.   Yes [provider]  diclofenac (VOLTAREN) 75 MG EC tablet Take 75 mg by mouth 2 (two) times daily.   Yes [provider]  flecainide (TAMBOCOR) 50 MG tablet Take 50 mg by mouth as needed.   Yes [provider]  gabapentin (NEURONTIN) 300 MG capsule Take 300 mg by mouth 3 (three) times daily.   Yes [provider]  lidocaine (LIDODERM) 5 % Place 1 patch onto the skin daily. Remove & Discard patch within 12 hours or as directed by MD   Yes [provider]  losartan (COZAAR) 50 MG tablet Take 50 mg by mouth 2 (two) times daily.   Yes [provider]  magnesium oxide (MAG-OX) 400 MG tablet Take 400 mg by mouth daily.   Yes [provider]  nitroGLYCERIN (NITRODUR - DOSED IN MG/24 HR) 0.4 mg/hr Place 1 patch onto the skin as needed.     Yes [provider]  rosuvastatin (CRESTOR) 10 MG tablet Take 10 mg by mouth daily.  Yes [provider]  tizanidine (ZANAFLEX) 2 MG capsule Take 2 mg by mouth at bedtime as needed for muscle spasms.   Yes [provider]    Family History No family history on file.  Social History Social History   Tobacco Use  . Smoking status: Never Smoker  . Smokeless tobacco: Never Used  Substance Use Topics  . Alcohol use: No  . Drug use: No     Allergies   Compazine; Ketorolac tromethamine; Morphine and related; Nsaids; Toradol [ketorolac tromethamine]; Tramadol; and Demerol   Review of Systems Review of Systems  Constitutional: Negative for fever.  Gastrointestinal: Positive for abdominal pain, diarrhea and nausea. Negative for constipation, hematochezia, melena and vomiting.  Genitourinary: Negative for  dysuria.  Musculoskeletal: Positive for myalgias.  Psychiatric/Behavioral: The patient is nervous/anxious.   All other systems reviewed and are negative.    Physical Exam Updated Vital Signs BP (!) 164/111 (BP Location: Left Arm)   Pulse (!) 106   Temp 98.1 F (36.7 C) (Oral)   Resp 20   Ht 1.575 m (5\' 2" )   Wt 99.8 kg   SpO2 99%   BMI 40.24 kg/m   Physical Exam  CONSTITUTIONAL: Well developed/well nourished, anxious appearing HEAD: Normocephalic/atraumatic EYES: EOMI/PERRL ENMT: Mucous membranes moist NECK: supple no meningeal signs SPINE/BACK:entire spine nontender CV: S1/S2 noted, no murmurs/rubs/gallops noted LUNGS: Lungs are clear to auscultation bilaterally, no apparent distress ABDOMEN: soft, moderate epigastric tenderness, no rebound or guarding, bowel sounds noted throughout abdomen, no distention GU:no cva tenderness NEURO: Pt is awake/alert/appropriate, moves all extremitiesx4.  No facial droop.   EXTREMITIES: pulses normal/equal, full ROM SKIN: warm, color normal PSYCH: Anxious ED Treatments / Results  Labs (all labs ordered are listed, but only abnormal results are displayed) Labs Reviewed  I-STAT CHEM 8, ED - Abnormal; Notable for the following components:      Result Value   Potassium 2.9 (*)    Glucose, Bld 106 (*)    Calcium, Ion 1.13 (*)    All other components within normal limits  CBC WITH DIFFERENTIAL/PLATELET  URINALYSIS, ROUTINE W REFLEX MICROSCOPIC    EKG None  Radiology No results found.  Procedures Procedures   Medications Ordered in ED Medications  fentaNYL (SUBLIMAZE) injection 100 mcg (100 mcg Intravenous Given 12/01/17 0608)  ondansetron (ZOFRAN) injection 4 mg (4 mg Intravenous Given 12/01/17 0254)  fentaNYL (SUBLIMAZE) injection 100 mcg (100 mcg Intravenous Given 12/01/17 0704)  iopamidol (ISOVUE-300) 61 % injection 100 mL (100 mLs Intravenous Contrast Given 12/01/17 0647)     Initial Impression / Assessment and Plan / ED  Course  I have reviewed the triage vital signs and the nursing notes.  Pertinent labs  results that were available during my care of the patient were reviewed by me and considered in my medical decision making (see chart for details).     6:29 AM Patient presented with increasing abdominal pain for the past 3 days.  She reports nausea, diarrhea.  She has significant tenderness in the epigastric region.  She is s/p cholecystectomy Pancreatitis is possible, but low risk for this as she denies EtOH abuse Due to continued pain, will proceed with CT imaging.  Lipase and LFT testing are not available at this time due to lab shutdown 7:07 AM Signed out to Dr Laverta Baltimore with CT imaging pending  Final Clinical Impressions(s) / ED Diagnoses   Final diagnoses:  None    ED Discharge Orders    None  Ripley Fraise, MD 12/01/17 [phone]

## 2017-12-01 NOTE — ED Notes (Signed)
Attempted IV in right hand unsuccessful.

## 2017-12-01 NOTE — ED Provider Notes (Signed)
Blood pressure 140/82, pulse 93, temperature 98.1 F (36.7 C), temperature source Oral, resp. rate (!) 21, height 5\' 2"  (1.575 m), weight 99.8 kg, SpO2 96 %.  Assuming care from Dr. Christy Gentles.  In short, Caitlin Williams is a 62 y.o. female with a chief complaint of Abdominal Pain .  Refer to the original H&P for additional details.  The current plan of care is to follow up CT abdomen pelvis and reassess.  7:46 AM CT scan reviewed with no acute findings.  Patient is feeling better after Zofran.  Her pain is significantly improved.  She is reporting that she is ready to return home.  She does have hypokalemia noted on lab work.  I will provide potassium supplementation and Zofran.  She has Bentyl at home as well as PPIs.  Advised that she follow-up with a gastroenterologist and did provide her with the contact information for an outpatient provider. States that she has seen GI at [facility] in the past and will reach out to them.   At this time, I do not feel there is any life-threatening condition present. I have reviewed and discussed all results (EKG, imaging, lab, urine as appropriate), exam findings with patient. I have reviewed nursing notes and appropriate previous records.  I feel the patient is safe to be discharged home without further emergent workup. Discussed usual and customary return precautions. Patient and family (if present) verbalize understanding and are comfortable with this plan.  Patient will follow-up with their primary care provider. If they do not have a primary care provider, information for follow-up has been provided to them. All questions have been answered.  Nanda Quinton, MD    Margette Fast, MD 12/01/17 [phone]

## 2017-12-01 NOTE — Discharge Instructions (Signed)

## 2017-12-09 ENCOUNTER — Encounter: Payer: Self-pay | Admitting: Gastroenterology

## 2017-12-16 HISTORY — PX: ROTATOR CUFF REPAIR: SHX139

## 2017-12-20 ENCOUNTER — Ambulatory Visit: Payer: Medicaid Other | Admitting: Gastroenterology

## 2018-01-04 ENCOUNTER — Ambulatory Visit: Payer: Medicaid Other | Admitting: Gastroenterology

## 2018-01-13 ENCOUNTER — Encounter: Payer: Self-pay | Admitting: Gastroenterology

## 2018-01-13 ENCOUNTER — Ambulatory Visit: Payer: Medicaid Other | Admitting: Gastroenterology

## 2018-01-13 VITALS — BP 142/86 | HR 96 | Ht 62.0 in | Wt 213.4 lb

## 2018-01-13 DIAGNOSIS — K219 Gastro-esophageal reflux disease without esophagitis: Secondary | ICD-10-CM | POA: Diagnosis not present

## 2018-01-13 DIAGNOSIS — R1013 Epigastric pain: Secondary | ICD-10-CM | POA: Diagnosis not present

## 2018-01-13 DIAGNOSIS — R110 Nausea: Secondary | ICD-10-CM | POA: Diagnosis not present

## 2018-01-13 DIAGNOSIS — Z860100 Personal history of colon polyps, unspecified: Secondary | ICD-10-CM

## 2018-01-13 DIAGNOSIS — R0989 Other specified symptoms and signs involving the circulatory and respiratory systems: Secondary | ICD-10-CM

## 2018-01-13 DIAGNOSIS — K582 Mixed irritable bowel syndrome: Secondary | ICD-10-CM | POA: Diagnosis not present

## 2018-01-13 DIAGNOSIS — R09A2 Foreign body sensation, throat: Secondary | ICD-10-CM

## 2018-01-13 MED ORDER — ONDANSETRON HCL 4 MG PO TABS: 4.0000 mg | ORAL_TABLET | Freq: Four times a day (QID) | ORAL | 1 refills | Status: DC | PRN

## 2018-01-13 MED ORDER — FAMOTIDINE 20 MG PO TABS: 20.0000 mg | ORAL_TABLET | Freq: Every day | ORAL | 11 refills | Status: AC

## 2018-01-13 NOTE — Patient Instructions (Addendum)
If you are age 62 or older, your body mass index should be between 23-30. Your Body mass index is 39.03 kg/m. If this is out of the aforementioned range listed, please consider follow up with your Primary Care Provider.  If you are age 105 or younger, your body mass index should be between 19-25. Your Body mass index is 39.03 kg/m. If this is out of the aformentioned range listed, please consider follow up with your Primary Care Provider.   We have sent the following medications to your pharmacy for you to pick up at your convenience: Zofran 4mg  by mouth every 6 hours as needed. Pepcid 20mg  at bedtime every night.  It was a pleasure to see you today!  Vito Cirigliano, D.O.

## 2018-01-13 NOTE — Progress Notes (Signed)
Chief Complaint: Epigastric pain, change in bowel habits, dysphagia, nausea, anorexia   Referring Provider:     Self    HPI:     Caitlin Williams is a 62 y.o. female with a history of IBS-Mixed type and functional dyspepsia, previously followed with [place], referred to the [facility] for evaluation of multiple GI sxs. States she has been dealing with GI symptoms for many years and her present symptoms are not different from her chronic GI issues.  Typically starts as MEG, non-radiating pain followed by nausea and decreased appetite. Sxs occur every couple of months. Now with diarrhea as well which started yesterday. No hematochezia or melena. Takes Protonix, dicyclomine prn, Zofran prn. Sxs flare 1-2 times per month, lasting 1-2 days. Currenlty with sxs since yesterday. Started taking Peppermint with some clinical improvement.   Symptoms tend to respond to the medications that she has along with dietary modifications-avoids dairy, spicy foods.   Additionally, she endorses a history of reflux that is generally well controlled with Protonix (she takes 3 times daily) but with recent breakthrough nocturnal and early a.m. symptoms.  Breakthrough symptoms described as regurgitation and heartburn.  Started to have dysphagia/globus sensation (has had this in the past with empiric dilation 2016), pointing to anterior neck/suprasternal notch. Occurs with solids and pills. Has been ongoing for the last few months. Was present prior to recent left rotator cuff surgery, but worse in the last 4 weeks or so. No regurgitation/vomiting; clears with fluids.   Recent fall with subsequent left rotator cuff tear requiring surgery last month.   GI history: Patient has had extensive workup for chronic nausea, regurgitation, and dyspepsia including: EGD with empiric dilation (11/2014), Normal EGD (06/2012), normal 4 hour gastric emptying study (06/2012), normal Esophageal pH study off  PPI (03/2011), normal esophageal manometry (03/2011).   Additional evaluation includes colonoscopy (01/2014) with 5 small tubular adenomas with recommendation to repeat in 3 years for surveillance.  Biopsies negative for MC.   Past Medical History:  Diagnosis Date  . Acid reflux   . Asthma   . Cancer ([place])   . FHx: kidney cancer   . Gallstones   . Gastroparesis   . GERD (gastroesophageal reflux disease)   . Hypertension   . IBS (irritable bowel syndrome)   . Kidney stones   . Migraine   . Obesity   . Pacemaker   . WPW (Wolff-Parkinson-White syndrome)      Past Surgical History:  Procedure Laterality Date  . ABDOMINAL HYSTERECTOMY    . APPENDECTOMY  1980  . AV NODE ABLATION    . CARDIAC SURGERY    . CHOLECYSTECTOMY    . ESOPHAGOGASTRODUODENOSCOPY  2014   with [facility]   . HEMORRHOID SURGERY    . KIDNEY SURGERY    . KNEE SURGERY Right    x2   . OTHER SURGICAL HISTORY     open heart surgery - repair hole in heart  . PACEMAKER INSERTION     took out 1984  . PACEMAKER REMOVAL    . ROTATOR CUFF REPAIR Left 12/16/2017  . slap tear    . STOMACH SURGERY     Family History  Problem Relation Age of Onset  . Lung cancer Mother   . Breast cancer Maternal Grandmother   . Diabetes Maternal Grandmother   . Heart disease Maternal Grandmother   . Stomach cancer Maternal Uncle   . Breast  cancer Paternal Aunt   . Colon cancer Neg Hx   . Esophageal cancer Neg Hx    Social History   Tobacco Use  . Smoking status: Never Smoker  . Smokeless tobacco: Never Used  Substance Use Topics  . Alcohol use: No  . Drug use: No   Current Outpatient Medications  Medication Sig Dispense Refill  . carvedilol (COREG) 6.25 MG tablet Take 6.25 mg by mouth 2 (two) times daily with a meal.    . cloNIDine (CATAPRES) 0.1 MG tablet Take 0.1 mg by mouth as needed.    . diclofenac (VOLTAREN) 75 MG EC tablet Take 75 mg by mouth 2 (two) times daily.    Marland Kitchen dicyclomine (BENTYL) 20 MG tablet Take by  mouth as needed.    . flecainide (TAMBOCOR) 50 MG tablet Take 50 mg by mouth as needed.    . gabapentin (NEURONTIN) 300 MG capsule Take 300 mg by mouth 3 (three) times daily.    Marland Kitchen lidocaine (LIDODERM) 5 % Place 1 patch onto the skin daily. Remove & Discard patch within 12 hours or as directed by MD    . losartan (COZAAR) 50 MG tablet Take 50 mg by mouth 2 (two) times daily.    . magnesium oxide (MAG-OX) 400 MG tablet Take 400 mg by mouth daily.    . nitroGLYCERIN (NITRODUR - DOSED IN MG/24 HR) 0.4 mg/hr Place 1 patch onto the skin as needed.      . ondansetron (ZOFRAN ODT) 4 MG disintegrating tablet Take 1 tablet (4 mg total) by mouth every 8 (eight) hours as needed for nausea or vomiting. 20 tablet 0  . pantoprazole (PROTONIX) 40 MG tablet 3 (three) times daily.  1  . rosuvastatin (CRESTOR) 10 MG tablet Take 10 mg by mouth daily.    . tizanidine (ZANAFLEX) 2 MG capsule Take 2 mg by mouth at bedtime as needed for muscle spasms.    . potassium chloride SA (K-DUR,KLOR-CON) 20 MEQ tablet Take 2 tablets (40 mEq total) by mouth daily for 5 days. 10 tablet 0   No current facility-administered medications for this visit.    Allergies  Allergen Reactions  . Compazine   . Ketorolac Tromethamine Itching  . Morphine And Related Itching  . Nsaids Nausea And Vomiting  . Toradol [Ketorolac Tromethamine] Itching  . Tramadol Itching  . Demerol Hives and Palpitations     Review of Systems: All systems reviewed and negative except where noted in HPI.     Physical Exam:    Wt Readings from Last 3 Encounters:  01/13/18 213 lb 6 oz (96.8 kg)  12/01/17 220 lb (99.8 kg)  09/20/17 220 lb (99.8 kg)    BP (!) 142/86   Pulse 96   Ht 5\' 2"  (1.575 m)   Wt 213 lb 6 oz (96.8 kg)   BMI 39.03 kg/m  Constitutional:  Pleasant, in no acute distress.  Left arm in sling. Psychiatric: Normal mood and affect. Behavior is normal. EENT: Pupils normal.  Conjunctivae are normal. No scleral icterus. Neck supple.  No cervical LAD. Cardiovascular: Normal rate, regular rhythm. No edema Pulmonary/chest: Effort normal and breath sounds normal. No wheezing, rales or rhonchi. Abdominal: Soft, nondistended, nontender. Bowel sounds active throughout. There are no masses palpable. No hepatomegaly. Neurological: Alert and oriented to person place and time. Skin: Skin is warm and dry. No rashes noted.   ASSESSMENT AND PLAN;   Caitlin Williams is a 62 y.o. female presenting with a longstanding history of multiple  GI issues, without any recent changes but rather flare of her chronic issues.  1) Functional esophageal disorder: Has been evaluated in the past to include EGD x2 and EM/pH/impedance in all unrevealing for etiology.  She does seem to report some improvement with PPI.  Per prior notes was intolerant to amitriptyline (palpitations).  Does not report her symptoms as pain, but also could consider SNRI. -Resume PPI for now -If symptoms seem to be less well controlled, can consider SNRI  2) IBS: Longstanding history of IBS-mixed type. - Continue dicyclomine as needed -Provided with low FODMAP diet -Resume dietary modifications to avoid exacerbating foods -Resume high-fiber diet  3) History of colon polyps: Last colonoscopy in 01/2014 with 5 tubular adenomas. - Due for repeat colonoscopy for ongoing polyp surveillance.  However in light of recent left rotator cuff surgery, will defer timing until after healing  4) Functional dyspepsia: Responsive to peppermint and ongoing PPI. - Resume peppermint and can trial course of FD guard -Refill Zofran for associated nausea -Provided education on functional GI syndromes  5) Heartburn: Functional heartburn responsive to PPI but with recent nocturnal breakthrough symptoms per patient.  Symptoms functional per extensive work-up as above. -Add Pepcid 20 mg nightly for breakthrough nocturnal and a.m. symptoms -No plan for repeat EGD at this time.  6) Globus  sensation: Longstanding history of intermittent globus sensation with previous EGD in 2016 with empiric dilation (no endoscopy report available).  Unclear if she had any clinical response.  Provided education on this today.  No plan for repeat endoscopy at this time.  7) Nausea: -Refilled Zofran  - RTC in 6 months or sooner prn  I spent a total of 45 minutes of face-to-face time with the patient.  Extensive review of previous notes in EMR.  Greater than 50% of the time was spent counseling and coordinating care.    Caitlin Bullion, DO, FACG  01/13/2018, 2:16 PM   Francesca Oman, DO

## 2018-02-28 ENCOUNTER — Emergency Department (HOSPITAL_BASED_OUTPATIENT_CLINIC_OR_DEPARTMENT_OTHER): Payer: Medicaid Other

## 2018-02-28 ENCOUNTER — Encounter (HOSPITAL_BASED_OUTPATIENT_CLINIC_OR_DEPARTMENT_OTHER): Payer: Self-pay | Admitting: Emergency Medicine

## 2018-02-28 ENCOUNTER — Emergency Department (HOSPITAL_BASED_OUTPATIENT_CLINIC_OR_DEPARTMENT_OTHER)
Admission: EM | Admit: 2018-02-28 | Discharge: 2018-02-28 | Disposition: A | Discharge status: Home / Self Care | Payer: Medicaid Other | Attending: Emergency Medicine | Admitting: Emergency Medicine

## 2018-02-28 ENCOUNTER — Other Ambulatory Visit: Payer: Self-pay

## 2018-02-28 DIAGNOSIS — M25512 Pain in left shoulder: Secondary | ICD-10-CM | POA: Diagnosis present

## 2018-02-28 DIAGNOSIS — I10 Essential (primary) hypertension: Secondary | ICD-10-CM | POA: Diagnosis not present

## 2018-02-28 DIAGNOSIS — Z79899 Other long term (current) drug therapy: Secondary | ICD-10-CM | POA: Insufficient documentation

## 2018-02-28 DIAGNOSIS — Z950 Presence of cardiac pacemaker: Secondary | ICD-10-CM | POA: Insufficient documentation

## 2018-02-28 DIAGNOSIS — J45909 Unspecified asthma, uncomplicated: Secondary | ICD-10-CM | POA: Insufficient documentation

## 2018-02-28 MED ORDER — HYDROCODONE-ACETAMINOPHEN 5-325 MG PO TABS
1.0000 | ORAL_TABLET | ORAL | Status: AC
  Administered 2018-02-28: 1 via ORAL
  Filled 2018-02-28: qty 1

## 2018-02-28 MED ORDER — LIDOCAINE 5 % EX PTCH: 1.0000 | MEDICATED_PATCH | CUTANEOUS | 1 refills | Status: AC

## 2018-02-28 NOTE — ED Notes (Addendum)
Pt c/o left shoulder pain tht started x 6 days ago. SHe states she drove for 6 hours and feels pain and tightness in left shoulder. Pt denies injury. She states she has surgery for rotator cuff repair in Oct, 2019, currently in PT.Pt took tylenol at 0530 and voltaren at 4am with no relief.

## 2018-02-28 NOTE — Discharge Instructions (Addendum)
Take the medications as needed for pain, follow-up with your orthopedic doctor and physical therapist.  Follow-up with your primary care doctor to recheck your blood pressure

## 2018-02-28 NOTE — ED Provider Notes (Signed)
[facility] EMERGENCY DEPARTMENT Provider Note   CSN: [id number] Arrival date & time: 02/28/18  3557     History   Chief Complaint Chief Complaint  Patient presents with  . Shoulder Pain    HPI Caitlin Williams is a 63 y.o. female.  HPI Patient has a history of left shoulder surgery.  Patient states she had rotator cuff surgery back in October.  She is currently undergoing physical therapy.  Patient states she drove for the first time after her shoulder surgery over a week ago.  Since then she has had pain in her shoulder.  Patient states it hurts for her to lift or raise her arm.  She has some pain going into her neck.  She denies any numbness or weakness.  She denies any recent falls.  Patient called her orthopedic surgeon and has an appointment next week.  She was told if she had any worsening symptoms she could go to the emergency room for evaluation. Past Medical History:  Diagnosis Date  . Abnormal heart rhythm   . Acid reflux   . Asthma   . Cancer ([place])   . FHx: kidney cancer   . Gallstones   . Gastroparesis   . GERD (gastroesophageal reflux disease)   . Hypertension   . IBS (irritable bowel syndrome)   . Kidney stones   . Migraine   . Obesity   . Pacemaker   . WPW (Wolff-Parkinson-White syndrome)     There are no active problems to display for this patient.   Past Surgical History:  Procedure Laterality Date  . ABDOMINAL HYSTERECTOMY    . APPENDECTOMY  1980  . AV NODE ABLATION    . CARDIAC SURGERY    . CHOLECYSTECTOMY    . ESOPHAGOGASTRODUODENOSCOPY  2014   with [facility]   . HEMORRHOID SURGERY    . KIDNEY SURGERY    . KNEE SURGERY Right    x2   . OTHER SURGICAL HISTORY     open heart surgery - repair hole in heart  . PACEMAKER INSERTION     took out 1984  . PACEMAKER REMOVAL    . ROTATOR CUFF REPAIR Left 12/16/2017  . slap tear    . STOMACH SURGERY       OB History   No obstetric history on file.      Home Medications    Prior  to Admission medications   Medication Sig Start Date End Date Taking? Authorizing Provider  carvedilol (COREG) 6.25 MG tablet Take 6.25 mg by mouth 2 (two) times daily with a meal.    [provider]  cloNIDine (CATAPRES) 0.1 MG tablet Take 0.1 mg by mouth as needed.    [provider]  diclofenac (VOLTAREN) 75 MG EC tablet Take 75 mg by mouth 2 (two) times daily.    [provider]  dicyclomine (BENTYL) 20 MG tablet Take by mouth as needed.    [provider]  famotidine (PEPCID) 20 MG tablet Take 1 tablet (20 mg total) by mouth at bedtime. 01/13/18   Cirigliano, Vito V, DO  flecainide (TAMBOCOR) 50 MG tablet Take 50 mg by mouth as needed.    [provider]  gabapentin (NEURONTIN) 300 MG capsule Take 300 mg by mouth 3 (three) times daily.    [provider]  lidocaine (LIDODERM) 5 % Place 1 patch onto the skin daily. Remove & Discard patch within 12 hours or as directed by MD 02/28/18   Dorie Rank,  MD  losartan (COZAAR) 50 MG tablet Take 50 mg by mouth 2 (two) times daily.    [provider]  magnesium oxide (MAG-OX) 400 MG tablet Take 400 mg by mouth daily.    [provider]  nitroGLYCERIN (NITRODUR - DOSED IN MG/24 HR) 0.4 mg/hr Place 1 patch onto the skin as needed.      [provider]  ondansetron (ZOFRAN ODT) 4 MG disintegrating tablet Take 1 tablet (4 mg total) by mouth every 8 (eight) hours as needed for nausea or vomiting. 12/01/17   Long, Wonda Olds, MD  ondansetron (ZOFRAN) 4 MG tablet Take 1 tablet (4 mg total) by mouth every 6 (six) hours as needed for nausea or vomiting. 01/13/18   Cirigliano, Vito V, DO  pantoprazole (PROTONIX) 40 MG tablet 3 (three) times daily. 10/28/17   [provider]  potassium chloride SA (K-DUR,KLOR-CON) 20 MEQ tablet Take 2 tablets (40 mEq total) by mouth daily for 5 days. 12/01/17 12/06/17  Long, Wonda Olds, MD  rosuvastatin (CRESTOR) 10 MG tablet Take 10 mg by mouth daily.     [provider]  tizanidine (ZANAFLEX) 2 MG capsule Take 2 mg by mouth at bedtime as needed for muscle spasms.    [provider]    Family History Family History  Problem Relation Age of Onset  . Lung cancer Mother   . Breast cancer Maternal Grandmother   . Diabetes Maternal Grandmother   . Heart disease Maternal Grandmother   . Stomach cancer Maternal Uncle   . Breast cancer Paternal Aunt   . Colon cancer Neg Hx   . Esophageal cancer Neg Hx     Social History Social History   Tobacco Use  . Smoking status: Never Smoker  . Smokeless tobacco: Never Used  Substance Use Topics  . Alcohol use: No  . Drug use: No     Allergies   Compazine; Ketorolac tromethamine; Morphine and related; Nsaids; Toradol [ketorolac tromethamine]; Tramadol; and Demerol   Review of Systems Review of Systems  All other systems reviewed and are negative.    Physical Exam Updated Vital Signs BP (!) 179/109 (BP Location: Right Arm)   Pulse 99   Temp 98.3 F (36.8 C) (Oral)   Resp 18   Ht 1.575 m (5\' 2" )   Wt 98 kg   SpO2 98%   BMI 39.51 kg/m   Physical Exam Vitals signs and nursing note reviewed.  Constitutional:      General: She is not in acute distress.    Appearance: She is well-developed.  HENT:     Head: Normocephalic and atraumatic.     Right Ear: External ear normal.     Left Ear: External ear normal.  Eyes:     General: No scleral icterus.       Right eye: No discharge.        Left eye: No discharge.     Conjunctiva/sclera: Conjunctivae normal.  Neck:     Musculoskeletal: Neck supple.     Trachea: No tracheal deviation.  Cardiovascular:     Rate and Rhythm: Normal rate and regular rhythm.  Pulmonary:     Effort: Pulmonary effort is normal. No respiratory distress.     Breath sounds: Normal breath sounds. No stridor.  Abdominal:     General: There is no distension.  Musculoskeletal:        General: Tenderness present. No swelling or deformity.       Left shoulder: She exhibits decreased  range of motion, tenderness and pain. She exhibits no deformity, no spasm, normal pulse and normal strength.     Comments: Tenderness palpation around the left trapezius anterior and posterior left shoulder joint, no erythema, strong radial pulses distally, extremities are warm and well-perfused, normal sensation, decreased range of motion due to pain and discomfort  Skin:    General: Skin is warm and dry.     Findings: No rash.  Neurological:     Mental Status: She is alert.     Cranial Nerves: Cranial nerve deficit: no gross deficits.      ED Treatments / Results  Labs (all labs ordered are listed, but only abnormal results are displayed) Labs Reviewed - No data to display   Radiology Dg Shoulder Left  Result Date: 02/28/2018 CLINICAL DATA:  Pain EXAM: LEFT SHOULDER - 2+ VIEW COMPARISON:  December 03, 2015 FINDINGS: Oblique, Y scapular, axillary images were obtained. No fracture or dislocation. There is mild generalized joint space narrowing. No erosive change or intra-articular calcification. Visualized left lung is clear. IMPRESSION: Mild generalized osteoarthritic change.  No fracture or dislocation. Electronically Signed   By: Lowella Grip III M.D.   On: 02/28/2018 07:51    Procedures Procedures (including critical care time)  Medications Ordered in ED Medications  HYDROcodone-acetaminophen (NORCO/VICODIN) 5-325 MG per tablet 1 tablet (1 tablet Oral Given 02/28/18 0730)     Initial Impression / Assessment and Plan / ED Course  I have reviewed the triage vital signs and the nursing notes.  Pertinent labs & imaging results that were available during my care of the patient were reviewed by me and considered in my medical decision making (see chart for details).  Clinical Course as of Feb 28 814  Tue Feb 28, 2018  0725 Hydrocodone 1/9   [JK]    Clinical Course User Index [JK] Dorie Rank, MD    X-rays do not show any  significant injuries.  Patient does have scattered discrete tenderness in the shoulder area.  She has pain with range of motion.  I doubt cervical radiculopathy.  I suspect her symptoms are related to her persistent shoulder discomfort following her surgery.  Patient does have physical therapy that she is scheduled for.  She has outpatient follow-up with orthopedist Dr. I will have her try taking lidocaine patches.  She has several allergies and is unable to take NSAIDs.  Patient blood pressure was also elevated.  This may be related to her pain.  Recommended follow-up with her primary care doctor.  Final Clinical Impressions(s) / ED Diagnoses   Final diagnoses:  Acute pain of left shoulder  Hypertension, unspecified type    ED Discharge Orders         Ordered    lidocaine (LIDODERM) 5 %  Every 24 hours     02/28/18 0814           Dorie Rank, MD 02/28/18 [phone]

## 2018-02-28 NOTE — ED Triage Notes (Signed)
Pt c/o left shoulder pain radiating to collar bone. Pt states she had rotator cuff repiar surgery in Oct, drove for 7 hours x 6 days ago and now having severe shoulder pain. Denies injury

## 2018-02-28 NOTE — ED Notes (Signed)
Patient transported to X-ray 

## 2018-02-28 NOTE — ED Notes (Signed)
ED Provider at bedside. 

## 2018-06-16 ENCOUNTER — Other Ambulatory Visit: Payer: Self-pay | Admitting: Gastroenterology

## 2018-10-02 IMAGING — DX DG KNEE COMPLETE 4+V*R*
4 series · 4 of 4 positions shown · non-contrast
Comparison: 06/19/2011

CLINICAL DATA: Patient slipped on wet floor injuring right knee.

EXAM:
RIGHT KNEE - COMPLETE 4+ VIEW

[knee ap]
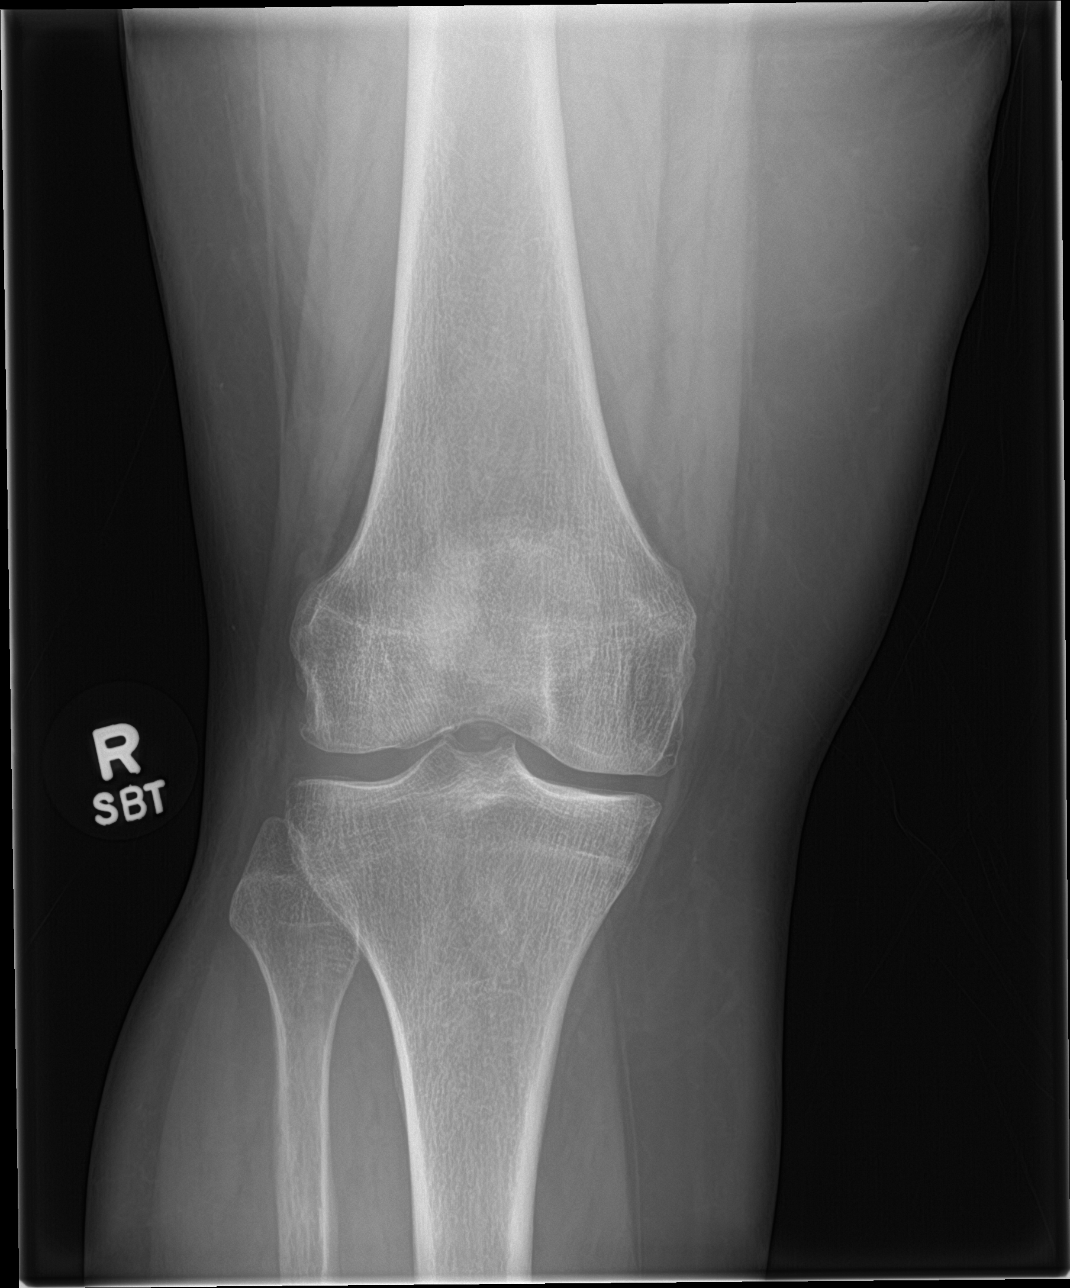

[knee lat]
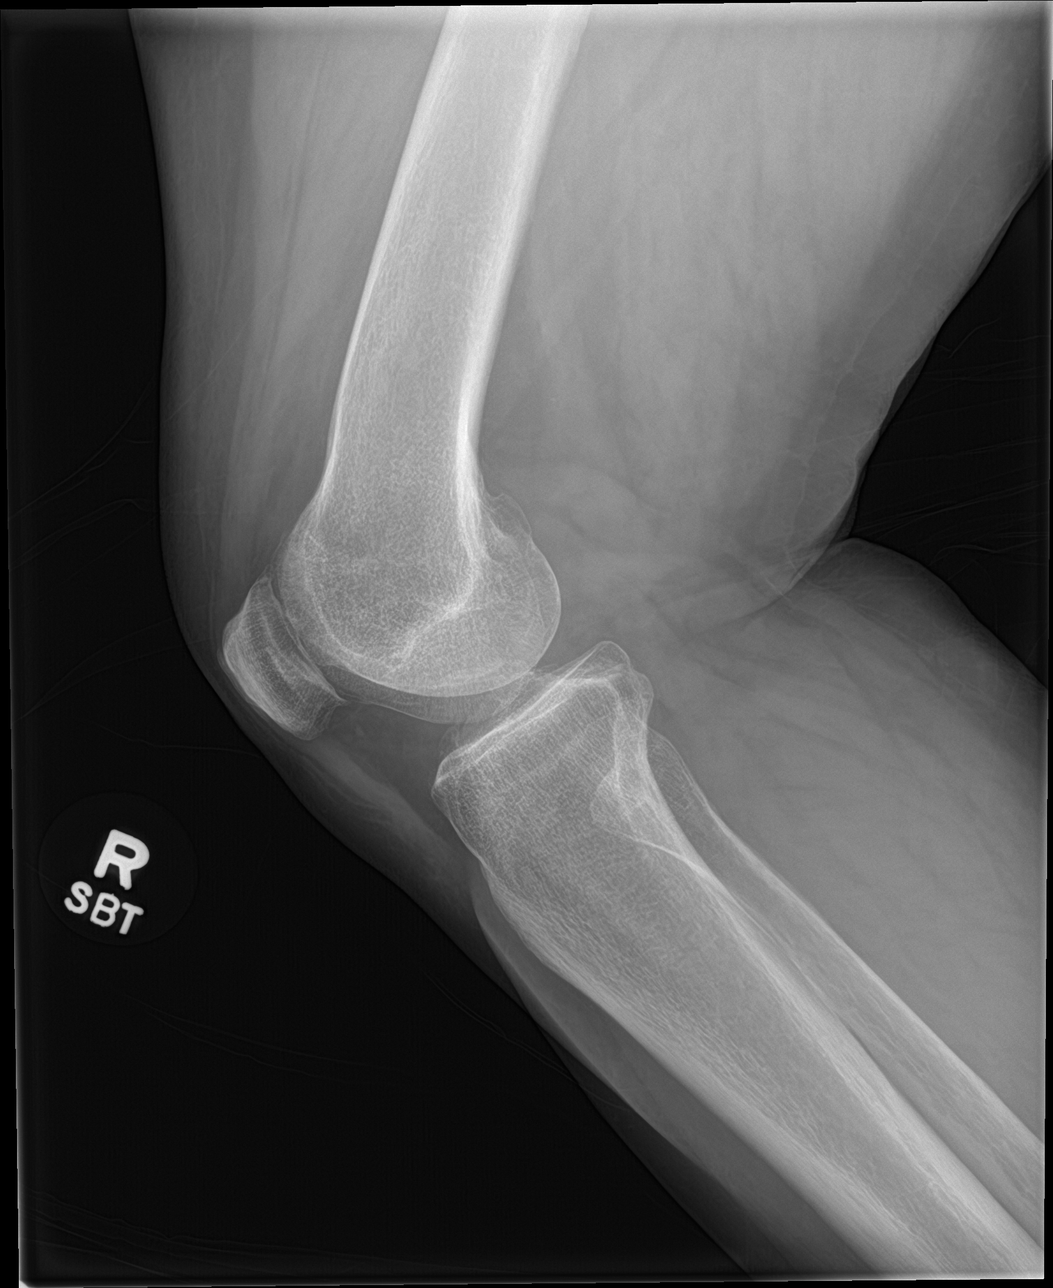

[knee obl (1 of 2)]
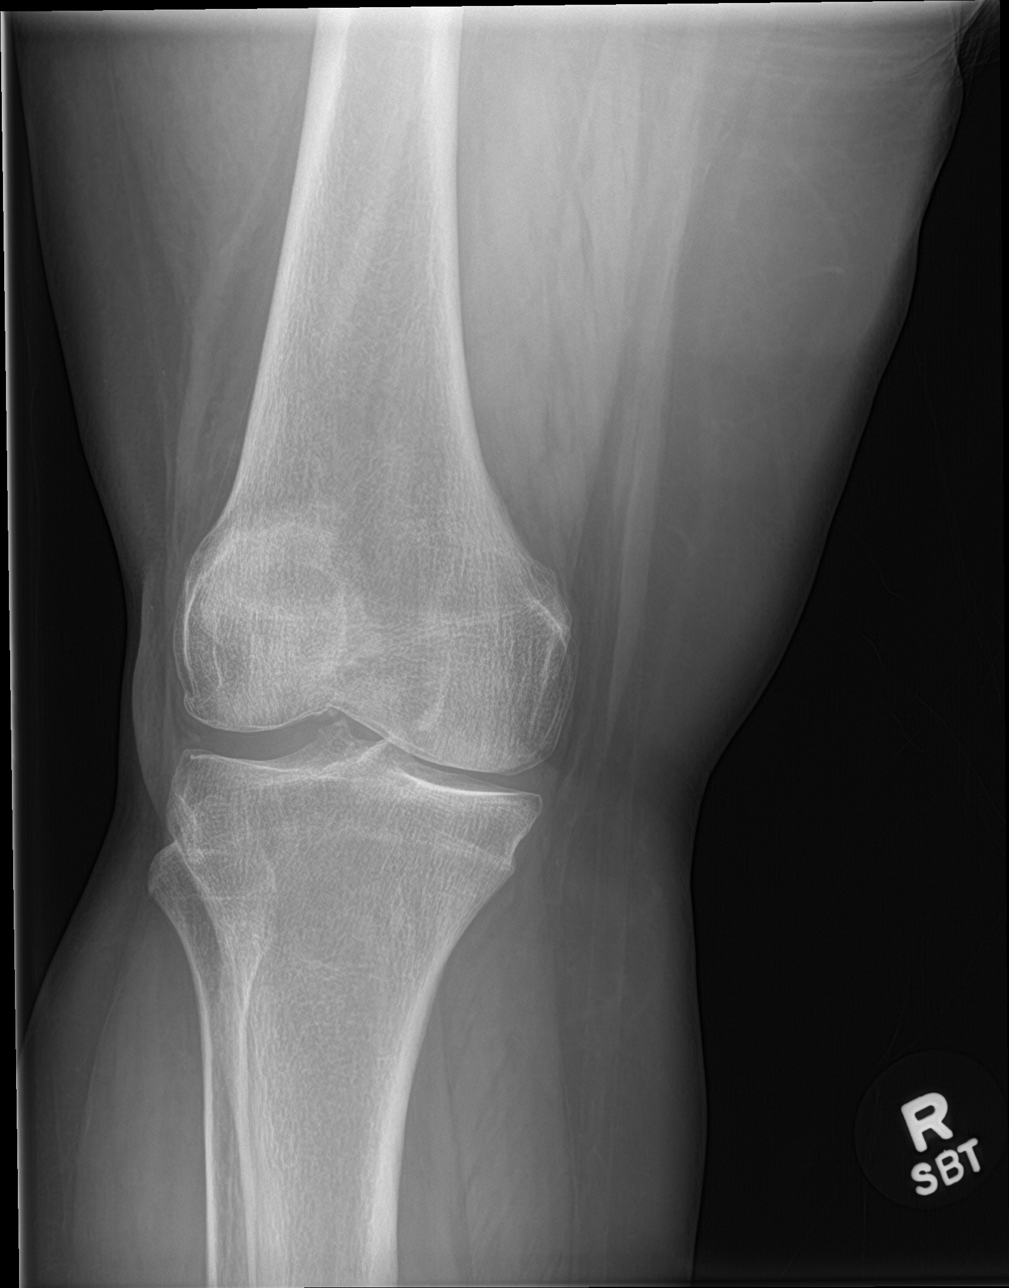

[knee obl (2 of 2)]
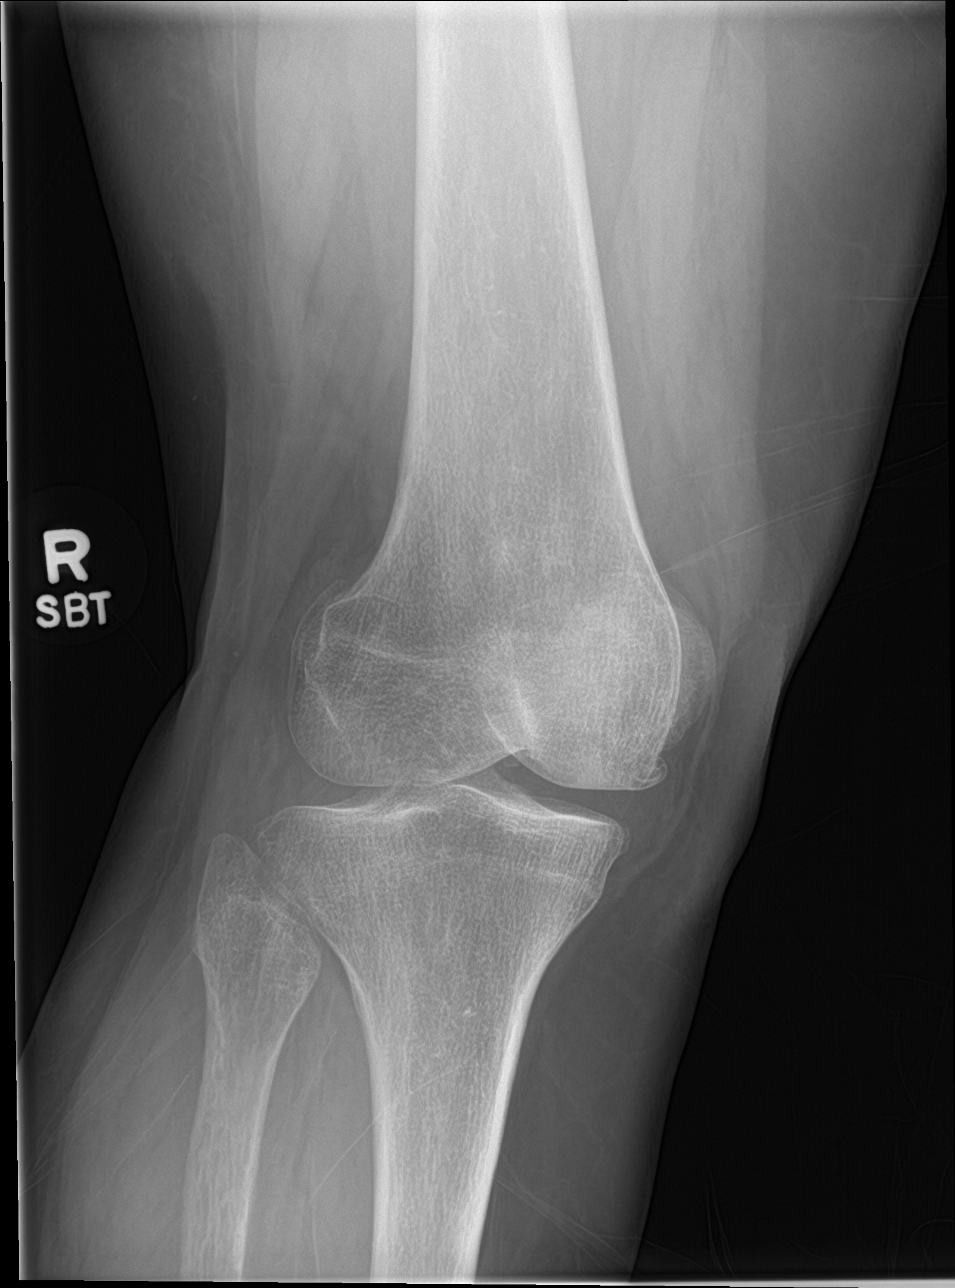

[4 of 4 positions shown; findings below may reference images not displayed]

FINDINGS: No acute fracture nor joint dislocation. No effusion. Tiny ossific
densities adjacent to the lateral tibial spine are present dating
back to 4718 consistent with old remote injury. Femorotibial and
patellofemoral joint space narrowing consistent with osteoarthritis.
Mild soft tissue swelling along the anteromedial aspect of the knee.
IMPRESSION: No acute fracture or joint dislocation. Mild osteoarthritic joint
space narrowing of the right knee. No joint effusion.

## 2018-11-09 ENCOUNTER — Other Ambulatory Visit: Payer: Self-pay

## 2018-11-09 ENCOUNTER — Encounter: Payer: Self-pay | Admitting: Gastroenterology

## 2018-11-09 ENCOUNTER — Ambulatory Visit (INDEPENDENT_AMBULATORY_CARE_PROVIDER_SITE_OTHER): Payer: Medicaid Other | Admitting: Gastroenterology

## 2018-11-09 VITALS — BP 122/84 | HR 88 | Temp 98.2°F | Ht 62.0 in | Wt 200.4 lb

## 2018-11-09 DIAGNOSIS — Z1159 Encounter for screening for other viral diseases: Secondary | ICD-10-CM | POA: Diagnosis not present

## 2018-11-09 DIAGNOSIS — R1013 Epigastric pain: Secondary | ICD-10-CM | POA: Diagnosis not present

## 2018-11-09 DIAGNOSIS — R110 Nausea: Secondary | ICD-10-CM

## 2018-11-09 DIAGNOSIS — K582 Mixed irritable bowel syndrome: Secondary | ICD-10-CM

## 2018-11-09 DIAGNOSIS — R1310 Dysphagia, unspecified: Secondary | ICD-10-CM

## 2018-11-09 MED ORDER — ONDANSETRON HCL 4 MG PO TABS: 4.0000 mg | ORAL_TABLET | Freq: Four times a day (QID) | ORAL | 5 refills | Status: AC | PRN

## 2018-11-09 MED ORDER — VENLAFAXINE HCL 25 MG PO TABS: 25.0000 mg | ORAL_TABLET | Freq: Every day | ORAL | 3 refills | Status: AC

## 2018-11-09 MED FILL — ONDANSETRON HCL 4 MG TABLET: 4 | 15 days supply | Qty: 60 | Fill #0

## 2018-11-09 MED FILL — VENLAFAXINE HCL 25 MG TAB: 25 | 90 days supply | Qty: 90 | Fill #0

## 2018-11-09 NOTE — Patient Instructions (Signed)
If you are age 63 or older, your body mass index should be between 23-30. Your Body mass index is 36.65 kg/m. If this is out of the aforementioned range listed, please consider follow up with your Primary Care Provider.  If you are age 59 or younger, your body mass index should be between 19-25. Your Body mass index is 36.65 kg/m. If this is out of the aformentioned range listed, please consider follow up with your Primary Care Provider.   To help prevent the possible spread of infection to our patients, communities, and staff; we will be implementing the following measures:  As of now we are not allowing any visitors/family members to accompany you to any upcoming appointments with [facility]. If you have any concerns about this please contact our office to discuss prior to the appointment.   You have been scheduled for an endoscopy. Please follow written instructions given to you at your visit today. If you use inhalers (even only as needed), please bring them with you on the day of your procedure. Your physician has requested that you go to www.startemmi.com and enter the access code given to you at your visit today. This web site gives a general overview about your procedure. However, you should still follow specific instructions given to you by our office regarding your preparation for the procedure.  Due to recent COVID-19 restrictions implemented by [employer] and state authorities and in an effort to keep both patients and staff as safe as possible, [facility] requires COVID-19 testing prior to any scheduled endoscopic procedure. The testing center is located at [address] [place], [place] [address] in the [facility] suite.  Your appointment has been scheduled for 10:00am on 11/23/2018.   Please bring your insurance cards to this appointment. You will require your COVID screen 2 business days prior to your endoscopic  procedure.  You are not required to quarantine after your screening.  You will only receive a phone call with the results if it is POSITIVE.  If you do not receive a call the day before your procedure you should begin your prep, if ordered, and you should report to the endo center for your procedure at your designated appointment arrival time ( one hour prior to the procedure time). There is no cost to you for the screening on the day of the swab.  [facility] will file with your insurance company for the testing.    You may receive an automated phone call prior to your procedure or have a message in your MyChart that you have an appointment for a BP/15 at the [facility], please disregard this message.  Your testing will be at the [address] , [place] [facility] location.   We have sent the following medications to your pharmacy for you to pick up at your convenience: Effexor 25mg  at bedtime. Zofran 4mg  every 6 hours as needed.  It was a pleasure to see you today!  Vito Cirigliano, D.O.

## 2018-11-09 NOTE — Progress Notes (Signed)
P  Chief Complaint:    Nausea/vomiting, abdominal pain, GERD  GI History: Caitlin Williams is a 63 y.o. female with a history of IBS-Mixed type and functional dyspepsia, previously followed with [facility], now following at [facility] for multiple GI sxs.   Additionally has a history of Wolff-Parkinson-White syndrome, hypertension, CAD, OSA, obesity (BMI 36.6), asthma.    1) Functional dyspepsia/Epigastric pain: Nonradiating pain, typically followed by nausea and decreased appetite.  Symptoms occur every couple of months.  Flare 1-2 times/month, typically lasting 1 to 2 days.  Has used Protonix, dicyclomine, Zofran, and peppermint with response to therapy.  2) Functional heartburn: Longstanding history of intermittent heartburn and regurgitation.  Essentially unrevealing extensive work-up as outlined below.  Symptoms generally well controlled with Protonix with intermittent nocturnal and early a.m. breakthrough.  Did not tolerate amitriptyline in the past (palpitations)  3) Dysphagia/Globus sensation: Occurs with solids and pills.  Has had EGD with empiric dilation 2016  4) IBS mixed type: Started low FODMAP diet.  Responsive to dicyclomine.  5) Nausea: Controlled with Zofran prn  Patient has had extensive workup for chronic nausea, regurgitation, and dyspepsia including: -EGD with empiric dilation (11/2014) -Normal EGD (06/2012) -Normal 4 hour gastric emptying study (06/2012) -Normal Esophageal pH study off PPI (03/2011) -Normal esophageal manometry (03/2011).   -Colonoscopy (01/2014) with 5 small tubular adenomas with recommendation to repeat in 3 years for surveillance.  Biopsies negative for [facility].  HPI:     Patient is a 63 y.o. female presenting [facility] for follow-up.  Initially seen by me in 12/2016 with multiple GI symptoms as above.  Today she states her nausea has increased again over the last few months. Did have kidney stones removed in July and Sept,  with AKI and n/v surrounding those episodes. No pain meds in weeks, but lingering nausea despite Zofran. Phenergan worked, but cost prohibitive.  No appreciable improvement with trial of Compazine per patient.  Continues to have dysphagia, and MEG pain. No food impactions. Requesting repeat EGD for ongoing sxs. Has had good response to Bentyl for IBS pain, but ran out a few months ago.  Describes the MEG pain as separate from her chronic IBS pain.  Was seen in the ER at [facility] ER on 11/05/2018 with hypertension and tachycardia. Reports was also in the ER in winter 2020 with n/v, treated wit IV antiemetics. Avoids caffeine, greasy fried foods due to exacerbation of sxs. Has intentionally lost 20# over the last few months.   Normal liver enzymes and CBC in 09/2018    Review of systems:     No chest pain, no SOB, no fevers, no urinary sx   Past Medical History:  Diagnosis Date  . Abnormal heart rhythm   . Acid reflux   . Asthma   . Cancer ([place])   . FHx: kidney cancer   . Gallstones   . Gastroparesis   . GERD (gastroesophageal reflux disease)   . Hypertension   . IBS (irritable bowel syndrome)   . Kidney stones   . Migraine   . Obesity   . Pacemaker   . WPW (Wolff-Parkinson-White syndrome)     Patient's surgical history, family medical history, social history, medications and allergies were all reviewed in Epic    Current Outpatient Medications  Medication Sig Dispense Refill  . carvedilol (COREG) 6.25 MG tablet Take 6.25 mg by mouth 2 (two) times daily with a meal.    . cloNIDine (CATAPRES) 0.1 MG  tablet Take 0.1 mg by mouth as needed.    . diclofenac (VOLTAREN) 75 MG EC tablet Take 75 mg by mouth 2 (two) times daily.    . famotidine (PEPCID) 20 MG tablet Take 1 tablet (20 mg total) by mouth at bedtime. 30 tablet 11  . flecainide (TAMBOCOR) 50 MG tablet Take 50 mg by mouth as needed.    . gabapentin (NEURONTIN) 300 MG capsule Take 300 mg by mouth 3 (three) times daily.    Marland Kitchen  lidocaine (LIDODERM) 5 % Place 1 patch onto the skin daily. Remove & Discard patch within 12 hours or as directed by MD 7 patch 1  . losartan (COZAAR) 50 MG tablet Take 50 mg by mouth 2 (two) times daily.    . magnesium oxide (MAG-OX) 400 MG tablet Take 400 mg by mouth daily.    . nitroGLYCERIN (NITRODUR - DOSED IN MG/24 HR) 0.4 mg/hr Place 1 patch onto the skin as needed.      . pantoprazole (PROTONIX) 40 MG tablet 3 (three) times daily.  1  . potassium chloride SA (K-DUR,KLOR-CON) 20 MEQ tablet Take 2 tablets (40 mEq total) by mouth daily for 5 days. 10 tablet 0  . rosuvastatin (CRESTOR) 10 MG tablet Take 10 mg by mouth daily.    . tizanidine (ZANAFLEX) 2 MG capsule Take 2 mg by mouth at bedtime as needed for muscle spasms.    Marland Kitchen dicyclomine (BENTYL) 20 MG tablet Take by mouth as needed.    . ondansetron (ZOFRAN ODT) 4 MG disintegrating tablet Take 1 tablet (4 mg total) by mouth every 8 (eight) hours as needed for nausea or vomiting. (Patient not taking: Reported on 11/09/2018) 20 tablet 0   No current facility-administered medications for this visit.     Physical Exam:     BP 122/84   Pulse 88   Temp 98.2 F (36.8 C)   Ht 5\' 2"  (1.575 m)   Wt 200 lb 6 oz (90.9 kg)   BMI 36.65 kg/m   GENERAL:  Pleasant female in NAD PSYCH: : Cooperative, normal affect EENT:  conjunctiva pink, mucous membranes moist, neck supple without masses CARDIAC:  RRR, no murmur heard, no peripheral edema PULM: Normal respiratory effort, lungs CTA bilaterally, no wheezing ABDOMEN:  Nondistended, soft, nontender. No obvious masses, no hepatomegaly,  normal bowel sounds SKIN:  turgor, no lesions seen Musculoskeletal:  Normal muscle tone, normal strength NEURO: Alert and oriented x 3, no focal neurologic deficits   IMPRESSION and PLAN:    1) Nausea without emesis 2) Functional heartburn -Provided with refill for Zofran -Starting on venlafaxine as below -Okay to continue Protonix, as she has response to  this therapy -Can stop Pepcid as this is not clinically beneficial to her  3) IBS -Mixed - Venlafaxine 25 mg qhs -Can stop Bentyl when starting venlafaxine -Resume high-fiber diet -Start fiber supplement  4) Dysphagia - EGD with dilation  5) MEG pain - EGD with bxs  The indications, risks, and benefits of EGD were explained to the patient in detail. Risks include but are not limited to bleeding, perforation, adverse reaction to medications, and cardiopulmonary compromise. Sequelae include but are not limited to the possibility of surgery, hositalization, and mortality. The patient verbalized understanding and wished to proceed. All questions answered, referred to scheduler. Further recommendations pending results of the exam.           Lavena Bullion ,DO, FACG 11/09/2018, 9:45 AM

## 2018-11-21 ENCOUNTER — Telehealth: Payer: Self-pay | Admitting: Gastroenterology

## 2018-11-21 NOTE — Telephone Encounter (Signed)
Changed patients Covid Screen to 11/23/2018 at 3:15pm.

## 2018-11-23 ENCOUNTER — Other Ambulatory Visit: Payer: Self-pay | Admitting: Gastroenterology

## 2018-11-24 LAB — SARS CORONAVIRUS 2 (TAT 6-24 HRS): SARS Coronavirus 2: NEGATIVE

## 2018-11-27 ENCOUNTER — Encounter: Payer: Medicaid Other | Admitting: Gastroenterology

## 2018-11-27 ENCOUNTER — Telehealth: Payer: Self-pay | Admitting: Gastroenterology

## 2018-11-27 NOTE — Telephone Encounter (Signed)
Hi Dr. Bryan Lemma, this pt just called to cancel her egd for this morning. She stated that she had a tooth removed and it got infected during the weekend. Her dentist wants her to wait until infection subsides. She will call back to reschedule. Thank you.

## 2018-11-27 NOTE — Telephone Encounter (Signed)
Ok, thank you

## 2018-12-05 ENCOUNTER — Telehealth: Payer: Self-pay | Admitting: Gastroenterology

## 2018-12-05 DIAGNOSIS — Z1159 Encounter for screening for other viral diseases: Secondary | ICD-10-CM

## 2018-12-05 NOTE — Telephone Encounter (Signed)
Called and spoke with patient-patient reports she rescheduled her EGD and needs to know if she has to repeat the [place] screening prior to her new date/time; patient advise she would need to complete another COVID screen-patient has been scheduled on 12/15/2018 at 1:10 pm; patient has already been rescheduled for her EGD on 12/19/2018 at 8:30 am; order for COVID screen placed and revised instructions have been mailed to the patient; patient advised if she does not receive her instructions at least 5 days prior to her procedure-to please call into the office for advisement; Patient advised to call back to the office at [phone] should questions/concerns arise;  Patient verbalized understanding of information/instructions;

## 2018-12-19 ENCOUNTER — Encounter: Payer: Medicaid Other | Admitting: Gastroenterology

## 2019-01-18 MED FILL — ONDANSETRON HCL 4 MG TABLET: 4 | 15 days supply | Qty: 60 | Fill #1
